# Patient Record
Sex: Male | Born: 1965 | Race: Black or African American | Hispanic: No | Marital: Married | Smoking: Never smoker
Health system: Southern US, Community
[De-identification: ages and names within clinical notes are randomized; demographics above are authoritative.]

## PROBLEM LIST (undated history)

## (undated) DIAGNOSIS — J45909 Unspecified asthma, uncomplicated: Secondary | ICD-10-CM

## (undated) DIAGNOSIS — E559 Vitamin D deficiency, unspecified: Secondary | ICD-10-CM

## (undated) DIAGNOSIS — I10 Essential (primary) hypertension: Secondary | ICD-10-CM

## (undated) HISTORY — PX: KNEE ARTHROSCOPY: SUR90

---
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
## 2015-03-15 ENCOUNTER — Encounter (HOSPITAL_BASED_OUTPATIENT_CLINIC_OR_DEPARTMENT_OTHER): Payer: Self-pay | Admitting: Emergency Medicine

## 2015-03-15 ENCOUNTER — Emergency Department (HOSPITAL_BASED_OUTPATIENT_CLINIC_OR_DEPARTMENT_OTHER)
Admission: EM | Admit: 2015-03-15 | Discharge: 2015-03-15 | Disposition: A | Discharge status: Home / Self Care | Payer: Self-pay | Attending: Physician Assistant | Admitting: Physician Assistant

## 2015-03-15 DIAGNOSIS — M5442 Lumbago with sciatica, left side: Secondary | ICD-10-CM | POA: Insufficient documentation

## 2015-03-15 DIAGNOSIS — M5432 Sciatica, left side: Secondary | ICD-10-CM

## 2015-03-15 DIAGNOSIS — I10 Essential (primary) hypertension: Secondary | ICD-10-CM | POA: Insufficient documentation

## 2015-03-15 DIAGNOSIS — J45909 Unspecified asthma, uncomplicated: Secondary | ICD-10-CM | POA: Insufficient documentation

## 2015-03-15 HISTORY — DX: Essential (primary) hypertension: I10

## 2015-03-15 HISTORY — DX: Unspecified asthma, uncomplicated: J45.909

## 2015-03-15 MED ORDER — IBUPROFEN 800 MG PO TABS
800.0000 mg | ORAL_TABLET | Freq: Once | ORAL | Status: AC
  Administered 2015-03-15: 800 mg via ORAL
  Filled 2015-03-15: qty 1

## 2015-03-15 MED ORDER — NAPROXEN 500 MG PO TABS: 500.0000 mg | ORAL_TABLET | Freq: Two times a day (BID) | ORAL | Status: DC

## 2015-03-15 MED ORDER — CYCLOBENZAPRINE HCL 10 MG PO TABS: 10.0000 mg | ORAL_TABLET | Freq: Two times a day (BID) | ORAL | Status: DC | PRN

## 2015-03-15 MED ORDER — CYCLOBENZAPRINE HCL 10 MG PO TABS
10.0000 mg | ORAL_TABLET | Freq: Once | ORAL | Status: AC
  Administered 2015-03-15: 10 mg via ORAL
  Filled 2015-03-15: qty 1

## 2015-03-15 NOTE — ED Notes (Signed)
Pt in c/o lower left back pain onset 3 days ago, denies injury or difficulty urinating. Denies incontinence.

## 2015-03-15 NOTE — ED Provider Notes (Signed)
CSN: [id number]     Arrival date & time 03/15/15  1955 History   First MD Initiated Contact with Patient 03/15/15 2015     Chief Complaint  Patient presents with  . Back Pain     (Consider location/radiation/quality/duration/timing/severity/associated sxs/prior Treatment) Patient is a 50 y.o. male presenting with back pain. The history is provided by the patient.  Back Pain Location:  Lumbar spine Radiates to: left buttock. Pain severity:  Severe Onset quality:  Gradual Duration:  3 days Timing:  Constant Progression:  Worsening Chronicity:  New Context: twisting   Relieved by:  None tried Worsened by:  Ambulation and bending Ineffective treatments:  None tried Associated symptoms: no bladder incontinence, no bowel incontinence and no dysuria    Stephen Castillo is a 50 y.o. male who presents to the ED with low back pain that started 3 days ago after he was reaching up and felt a pain in his lower back. He thought it was ok and it felt better the next morning but after going to work it got worse again. He has taken nothing for pain.   Past Medical History  Diagnosis Date  . Hypertension   . Asthma    History reviewed. No pertinent past surgical history. History reviewed. No pertinent family history. Social History  Substance Use Topics  . Smoking status: Never Smoker   . Smokeless tobacco: None  . Alcohol Use: No    Review of Systems  Gastrointestinal: Negative for bowel incontinence.  Genitourinary: Negative for bladder incontinence, dysuria, frequency and hematuria.  Musculoskeletal: Positive for back pain.  all other systems negative    Allergies  Review of patient's allergies indicates no known allergies.  Home Medications   Prior to Admission medications   Medication Sig Start Date End Date Taking? Authorizing Provider  cyclobenzaprine (FLEXERIL) 10 MG tablet Take 1 tablet (10 mg total) by mouth 2 (two) times daily as needed for muscle spasms. 03/15/15   Hope Orlene Och, NP  naproxen (NAPROSYN) 500 MG tablet Take 1 tablet (500 mg total) by mouth 2 (two) times daily. 03/15/15   Hope Orlene Och, NP   BP 147/99 mmHg  Pulse 73  Temp(Src) 98.2 F (36.8 C) (Oral)  Resp 18  Ht  (1.727 m)  Wt 97.523 kg  BMI 32.70 kg/m2  SpO2 100% Physical Exam  Constitutional: He is oriented to person, place, and time. He appears well-developed and well-nourished. No distress.  HENT:  Head: Normocephalic and atraumatic.  Eyes: EOM are normal.  Neck: Normal range of motion. Neck supple.  Cardiovascular: Normal rate and regular rhythm.   Pulmonary/Chest: Effort normal. No respiratory distress. He has no wheezes. He has no rales.  Abdominal: Soft. Bowel sounds are normal. There is no tenderness.  Musculoskeletal: Normal range of motion. He exhibits no edema.       Lumbar back: He exhibits tenderness and spasm. He exhibits normal range of motion, no deformity and normal pulse.       Back:  Pain radiates to the left buttock and there is tenderness over the left sciatic nerve with palpation.   Neurological: He is alert and oriented to person, place, and time. He has normal strength. No cranial nerve deficit or sensory deficit. Coordination and gait normal.  Reflex Scores:      Bicep reflexes are 2+ on the right side and 2+ on the left side.      Brachioradialis reflexes are 2+ on the right side and 2+ on the  left side.      Patellar reflexes are 2+ on the right side and 2+ on the left side. DP pulses 2+  Skin: Skin is warm and dry.  Psychiatric: He has a normal mood and affect. His behavior is normal.  Nursing note and vitals reviewed.   ED Course  Procedures MDM  50 y.o. male with 3 day history of low back pain that radiates to the left buttock stable for d/c without neuro deficits noted. No red flags for concern for immediate neuro consult. Will treat for muscle spasm and inflammation. Discussed with the patient and all questioned fully answered. He will return if  any problems arise.   Final diagnoses:  Sciatica, left        Janne Napoleon, NP 03/15/15 2235  Courteney Randall An, MD 03/15/15 2317

## 2015-03-15 NOTE — Discharge Instructions (Signed)

## 2015-03-15 NOTE — ED Notes (Signed)
Pt seen by EDNP prior to RN assessment, see NP notes, pending orders, pt alert, NAD, calm, interactive, resps e/u, no dyspnea noted, family at [facility].

## 2015-03-17 ENCOUNTER — Emergency Department (HOSPITAL_BASED_OUTPATIENT_CLINIC_OR_DEPARTMENT_OTHER): Payer: Self-pay

## 2015-03-17 ENCOUNTER — Emergency Department (HOSPITAL_BASED_OUTPATIENT_CLINIC_OR_DEPARTMENT_OTHER)
Admission: EM | Admit: 2015-03-17 | Discharge: 2015-03-18 | Disposition: A | Discharge status: Home / Self Care | Payer: Self-pay | Attending: Emergency Medicine | Admitting: Emergency Medicine

## 2015-03-17 ENCOUNTER — Encounter (HOSPITAL_BASED_OUTPATIENT_CLINIC_OR_DEPARTMENT_OTHER): Payer: Self-pay

## 2015-03-17 DIAGNOSIS — Z79899 Other long term (current) drug therapy: Secondary | ICD-10-CM | POA: Insufficient documentation

## 2015-03-17 DIAGNOSIS — M62830 Muscle spasm of back: Secondary | ICD-10-CM | POA: Insufficient documentation

## 2015-03-17 DIAGNOSIS — I10 Essential (primary) hypertension: Secondary | ICD-10-CM | POA: Insufficient documentation

## 2015-03-17 DIAGNOSIS — M545 Low back pain: Secondary | ICD-10-CM | POA: Insufficient documentation

## 2015-03-17 DIAGNOSIS — J45909 Unspecified asthma, uncomplicated: Secondary | ICD-10-CM | POA: Insufficient documentation

## 2015-03-17 DIAGNOSIS — Z791 Long term (current) use of non-steroidal anti-inflammatories (NSAID): Secondary | ICD-10-CM | POA: Insufficient documentation

## 2015-03-17 LAB — URINALYSIS, ROUTINE W REFLEX MICROSCOPIC
BILIRUBIN URINE: NEGATIVE
Glucose, UA: NEGATIVE mg/dL
HGB URINE DIPSTICK: NEGATIVE
KETONES UR: NEGATIVE mg/dL
Leukocytes, UA: NEGATIVE
Nitrite: NEGATIVE
PROTEIN: NEGATIVE mg/dL
Specific Gravity, Urine: 1.017 (ref 1.005–1.030)
pH: 5.5 (ref 5.0–8.0)

## 2015-03-17 MED ORDER — KETOROLAC TROMETHAMINE 60 MG/2ML IM SOLN
60.0000 mg | Freq: Once | INTRAMUSCULAR | Status: AC
  Administered 2015-03-17: 60 mg via INTRAMUSCULAR
  Filled 2015-03-17: qty 2

## 2015-03-17 MED ORDER — HYDROCODONE-ACETAMINOPHEN 5-325 MG PO TABS: 1.0000 | ORAL_TABLET | ORAL | Status: DC | PRN

## 2015-03-17 MED ORDER — HYDROMORPHONE HCL 1 MG/ML IJ SOLN
1.0000 mg | Freq: Once | INTRAMUSCULAR | Status: AC
  Administered 2015-03-18: 1 mg via INTRAMUSCULAR
  Filled 2015-03-17: qty 1

## 2015-03-17 MED ORDER — FENTANYL CITRATE (PF) 100 MCG/2ML IJ SOLN
100.0000 ug | Freq: Once | INTRAMUSCULAR | Status: AC
  Administered 2015-03-17: 100 ug via INTRAMUSCULAR
  Filled 2015-03-17: qty 2

## 2015-03-17 MED ORDER — DIAZEPAM 5 MG PO TABS
5.0000 mg | ORAL_TABLET | Freq: Once | ORAL | Status: AC
  Administered 2015-03-17: 5 mg via ORAL
  Filled 2015-03-17: qty 1

## 2015-03-17 MED ORDER — TIZANIDINE HCL 2 MG PO CAPS: 2.0000 mg | ORAL_CAPSULE | Freq: Three times a day (TID) | ORAL | Status: DC

## 2015-03-17 NOTE — ED Provider Notes (Signed)
CSN: [id number]     Arrival date & time 03/17/15  1846 History   First MD Initiated Contact with Patient 03/17/15 2028     Chief Complaint  Patient presents with  . Flank Pain     (Consider location/radiation/quality/duration/timing/severity/associated sxs/prior Treatment) HPI Stephen Castillo is a 50 y.o. male because in for evaluation of left lower back pain. Patient reports symptoms have been ongoing for the past one week. He does remember removing some 35 pound weights when his symptoms started. He reports being seen 2 days in the Emergency department, discharged with ibuprofen and muscle relaxer. He's been taking these medications without relief of his symptoms. Ambulation and certain movements exacerbate his symptoms. Nothing seems to make it better. He denies any urinary symptoms, abdominal pain, diarrhea or constipation, rash. He denies any fevers, chills, numbness or weakness, IV drug use, personal history of cancer. Patient has basilar bedside do express some concern for possible kidney stone.  Past Medical History  Diagnosis Date  . Hypertension   . Asthma    History reviewed. No pertinent past surgical history. No family history on file. Social History  Substance Use Topics  . Smoking status: Never Smoker   . Smokeless tobacco: None  . Alcohol Use: No    Review of Systems A 10 point review of systems was completed and was negative except for pertinent positives and negatives as mentioned in the history of present illness     Allergies  Review of patient's allergies indicates no known allergies.  Home Medications   Prior to Admission medications   Medication Sig Start Date End Date Taking? Authorizing Provider  ALBUTEROL IN Inhale into the lungs.   Yes Historical Provider, MD  Olmesartan-Amlodipine-HCTZ (TRIBENZOR PO) Take by mouth.   Yes Historical Provider, MD  cyclobenzaprine (FLEXERIL) 10 MG tablet Take 1 tablet (10 mg total) by mouth 2 (two) times daily as needed  for muscle spasms. 03/15/15   Hope Orlene Och, NP  HYDROcodone-acetaminophen (NORCO/VICODIN) 5-325 MG tablet Take 1-2 tablets by mouth every 4 (four) hours as needed. 03/17/15   Joycie Peek, PA-C  naproxen (NAPROSYN) 500 MG tablet Take 1 tablet (500 mg total) by mouth 2 (two) times daily. 03/15/15   Hope Orlene Och, NP  tizanidine (ZANAFLEX) 2 MG capsule Take 1 capsule (2 mg total) by mouth 3 (three) times daily. 03/17/15   Enya Bureau, PA-C   BP 135/100 mmHg  Pulse 106  Temp(Src) 99 F (37.2 C) (Oral)  Resp 18  Ht  (1.727 m)  Wt 98.884 kg  BMI 33.15 kg/m2  SpO2 95% Physical Exam  Constitutional:  Awake, alert, nontoxic appearance.  HENT:  Head: Atraumatic.  Eyes: Right eye exhibits no discharge. Left eye exhibits no discharge.  Neck: Neck supple.  Pulmonary/Chest: Effort normal. He exhibits no tenderness.  Abdominal: Soft. There is no tenderness. There is no rebound.  Musculoskeletal: He exhibits no tenderness.  Tenderness diffusely throughout left paraspinal lumbar musculature with no midline bony tenderness. There is spasm of the musculature. No rash or other abnormalities  Neurological:  Mental status and motor strength appears baseline for patient and situation. Motor strength is 5/5 in all 4 extremities. Sensation intact to light touch. Gait is antalgic but no ataxia  Skin: No rash noted.  Psychiatric: He has a normal mood and affect.  Nursing note and vitals reviewed.   ED Course  Procedures (including critical care time) Labs Review Labs Reviewed  URINE CULTURE  URINALYSIS, ROUTINE W REFLEX MICROSCOPIC (NOT  AT [facility])    Imaging Review US Renal  03/17/2015  CLINICAL DATA:  LEFT flank pain for 1 week. EXAM: RENAL / URINARY TRACT ULTRASOUND COMPLETE COMPARISON:  None. FINDINGS: Right Kidney: Length: 10.5 cm. Echogenicity within normal limits. No mass or hydronephrosis visualized. Left Kidney: Length: 10.7 cm. Echogenicity within normal limits. No mass or hydronephrosis  visualized. Prominent interpolar fat. Bladder: Appears normal for degree of bladder distention. IMPRESSION: Negative.  No stones or hydronephrosis. Electronically Signed   By: Elsie Stain M.D.   On: 03/17/2015 22:04   I have personally reviewed and evaluated these images and lab results as part of my medical decision-making.   EKG Interpretation None     Meds given in ED:  Medications  fentaNYL (SUBLIMAZE) injection 100 mcg (100 mcg Intramuscular Given 03/17/15 2054)  ketorolac (TORADOL) injection 60 mg (60 mg Intramuscular Given 03/17/15 2255)  diazepam (VALIUM) tablet 5 mg (5 mg Oral Given 03/17/15 2253)  HYDROmorphone (DILAUDID) injection 1 mg (1 mg Intramuscular Given 03/18/15 0006)    New Prescriptions   HYDROCODONE-ACETAMINOPHEN (NORCO/VICODIN) 5-325 MG TABLET    Take 1-2 tablets by mouth every 4 (four) hours as needed.   TIZANIDINE (ZANAFLEX) 2 MG CAPSULE    Take 1 capsule (2 mg total) by mouth 3 (three) times daily.   Filed Vitals:   03/17/15 1857 03/17/15 2120 03/18/15 0002  BP: 132/94 145/95 135/100  Pulse: 94 94 106  Temp: 98.1 F (36.7 C) 99.5 F (37.5 C) 99 F (37.2 C)  TempSrc: Oral Oral Oral  Resp: Height:  (1.727 m)    Weight: 98.884 kg    SpO2: 98% 97% 95%    MDM  Stephen Castillo is a 50 y.o. male presents for relation of left lower back pain. He does have tenderness to palpation on exam with a palpable spasm. Treated with IM fentanyl. They do express concern for kidney stone. Urinalysis is negative. However, we will obtain ultrasound to evaluate for any hydronephrosis. Ultrasound shows no hydronephrosis or other abnormalities. Symptoms likely secondary to musculoskeletal pain. Nonfocal neuro exam. No pathologic back pain red flags. Plan to discharge with short course pain medicines. Discussed we'll try different muscle relaxer and to discontinue use of previously prescribed muscle relaxer. Patient verbalized understanding and agrees with this plan.  Medication precautions discussed at length the patient agrees. Strict return precautions given including worsening pain, fever, numbness or weakness, changes in bowel or bladder habits. Patient verbalizes understanding and agrees with plan. Final diagnoses:  Left low back pain, with sciatica presence unspecified      Joycie Peek, PA-C 03/18/15 0111  Nelva Nay, MD 03/19/15 [phone]

## 2015-03-17 NOTE — ED Notes (Signed)
C/o left flak pain x 1 week-denies injury, n/v/d, urinary s/s

## 2015-03-17 NOTE — ED Notes (Addendum)
Pt using muscle relaxer that was given along with ibuprofen and ice. States it is not getting any better and symptoms persist.   Addendum: Pt has difficulty with ambulation.

## 2015-03-17 NOTE — ED Notes (Signed)
Came to room to discharge pt.  He stood up and said the pain was still there and too severe to go home.

## 2015-03-17 NOTE — ED Notes (Addendum)
Pt and wife given d/c instructions. Rx x 2. Narcotic prec given. Verbalizes understanding. No questions.

## 2015-03-17 NOTE — ED Notes (Signed)
PA notified that patients pain is not under control. New order obtained.

## 2015-03-17 NOTE — ED Notes (Signed)
Before med admin. PA has been requested at bedside.

## 2015-03-17 NOTE — Discharge Instructions (Signed)
There does not appear to be an emergent cause for your symptoms at this time. Your symptoms are likely due to a musculoskeletal strain. Your urine showed no evidence of infection your ultrasound also showed no sign of kidney stone. Please take your medications as prescribed and as we discussed. Follow up with your doctor as needed for reevaluation. Return to ED for any new or worsening symptoms as we discussed.  Back Pain, Adult Back pain is very common in adults.The cause of back pain is rarely dangerous and the pain often gets better over time.The cause of your back pain may not be known. Some common causes of back pain include:  Strain of the muscles or ligaments supporting the spine.  Wear and tear (degeneration) of the spinal disks.  Arthritis.  Direct injury to the back. For many people, back pain may return. Since back pain is rarely dangerous, most people can learn to manage this condition on their own. HOME CARE INSTRUCTIONS Watch your back pain for any changes. The following actions may help to lessen any discomfort you are feeling:  Remain active. It is stressful on your back to sit or stand in one place for long periods of time. Do not sit, drive, or stand in one place for more than 30 minutes at a time. Take short walks on even surfaces as soon as you are able.Try to increase the length of time you walk each day.  Exercise regularly as directed by your health care provider. Exercise helps your back heal faster. It also helps avoid future injury by keeping your muscles strong and flexible.  Do not stay in bed.Resting more than 1-2 days can delay your recovery.  Pay attention to your body when you bend and lift. The most comfortable positions are those that put less stress on your recovering back. Always use proper lifting techniques, including:  Bending your knees.  Keeping the load close to your body.  Avoiding twisting.  Find a comfortable position to sleep. Use a firm  mattress and lie on your side with your knees slightly bent. If you lie on your back, put a pillow under your knees.  Avoid feeling anxious or stressed.Stress increases muscle tension and can worsen back pain.It is important to recognize when you are anxious or stressed and learn ways to manage it, such as with exercise.  Take medicines only as directed by your health care provider. Over-the-counter medicines to reduce pain and inflammation are often the most helpful.Your health care provider may prescribe muscle relaxant drugs.These medicines help dull your pain so you can more quickly return to your normal activities and healthy exercise.  Apply ice to the injured area:  Put ice in a plastic bag.  Place a towel between your skin and the bag.  Leave the ice on for 20 minutes, 2-3 times a day for the first 2-3 days. After that, ice and heat may be alternated to reduce pain and spasms.  Maintain a healthy weight. Excess weight puts extra stress on your back and makes it difficult to maintain good posture. SEEK MEDICAL CARE IF:  You have pain that is not relieved with rest or medicine.  You have increasing pain going down into the legs or buttocks.  You have pain that does not improve in one week.  You have night pain.  You lose weight.  You have a fever or chills. SEEK IMMEDIATE MEDICAL CARE IF:   You develop new bowel or bladder control problems.  You have  unusual weakness or numbness in your arms or legs.  You develop nausea or vomiting.  You develop abdominal pain.  You feel faint.   This information is not intended to replace advice given to you by your health care provider. Make sure you discuss any questions you have with your health care provider.   Document Released: 01/25/2005 Document Revised: 02/15/2014 Document Reviewed: 05/29/2013 Elsevier Interactive Patient Education [employer].

## 2015-03-17 NOTE — ED Notes (Signed)
Patient transported to Ultrasound 

## 2015-03-18 LAB — URINE CULTURE: Culture: NO GROWTH

## 2015-03-18 NOTE — ED Notes (Signed)
PA at bedside.

## 2018-11-09 DIAGNOSIS — E559 Vitamin D deficiency, unspecified: Secondary | ICD-10-CM

## 2018-11-09 DIAGNOSIS — I10 Essential (primary) hypertension: Secondary | ICD-10-CM

## 2018-11-09 HISTORY — DX: Essential (primary) hypertension: I10

## 2018-11-09 HISTORY — DX: Vitamin D deficiency, unspecified: E55.9

## 2018-11-16 ENCOUNTER — Emergency Department (HOSPITAL_BASED_OUTPATIENT_CLINIC_OR_DEPARTMENT_OTHER): Payer: Self-pay

## 2018-11-16 ENCOUNTER — Observation Stay (HOSPITAL_BASED_OUTPATIENT_CLINIC_OR_DEPARTMENT_OTHER)
Admission: EM | Admit: 2018-11-16 | Discharge: 2018-11-17 | Disposition: A | Discharge status: Home / Self Care | Payer: Self-pay | Attending: Internal Medicine | Admitting: Internal Medicine

## 2018-11-16 ENCOUNTER — Other Ambulatory Visit: Payer: Self-pay

## 2018-11-16 ENCOUNTER — Encounter (HOSPITAL_BASED_OUTPATIENT_CLINIC_OR_DEPARTMENT_OTHER): Payer: Self-pay | Admitting: Emergency Medicine

## 2018-11-16 DIAGNOSIS — I2699 Other pulmonary embolism without acute cor pulmonale: Secondary | ICD-10-CM | POA: Diagnosis present

## 2018-11-16 DIAGNOSIS — N183 Chronic kidney disease, stage 3 unspecified: Secondary | ICD-10-CM | POA: Diagnosis present

## 2018-11-16 DIAGNOSIS — Z79899 Other long term (current) drug therapy: Secondary | ICD-10-CM | POA: Insufficient documentation

## 2018-11-16 DIAGNOSIS — I129 Hypertensive chronic kidney disease with stage 1 through stage 4 chronic kidney disease, or unspecified chronic kidney disease: Secondary | ICD-10-CM | POA: Insufficient documentation

## 2018-11-16 DIAGNOSIS — N1831 Chronic kidney disease, stage 3a: Secondary | ICD-10-CM

## 2018-11-16 DIAGNOSIS — Z23 Encounter for immunization: Secondary | ICD-10-CM | POA: Insufficient documentation

## 2018-11-16 DIAGNOSIS — I2693 Single subsegmental pulmonary embolism without acute cor pulmonale: Principal | ICD-10-CM

## 2018-11-16 DIAGNOSIS — I10 Essential (primary) hypertension: Secondary | ICD-10-CM | POA: Diagnosis present

## 2018-11-16 DIAGNOSIS — J45909 Unspecified asthma, uncomplicated: Secondary | ICD-10-CM | POA: Insufficient documentation

## 2018-11-16 DIAGNOSIS — Z20828 Contact with and (suspected) exposure to other viral communicable diseases: Secondary | ICD-10-CM | POA: Insufficient documentation

## 2018-11-16 LAB — SARS CORONAVIRUS 2 (TAT 6-24 HRS): SARS Coronavirus 2: NEGATIVE

## 2018-11-16 LAB — COMPREHENSIVE METABOLIC PANEL
ALT: 29 U/L (ref 0–44)
AST: 23 U/L (ref 15–41)
Albumin: 4.2 g/dL (ref 3.5–5.0)
Alkaline Phosphatase: 71 U/L (ref 38–126)
Anion gap: 9 (ref 5–15)
BUN: 18 mg/dL (ref 6–20)
CO2: 27 mmol/L (ref 22–32)
Calcium: 8.7 mg/dL — ABNORMAL LOW (ref 8.9–10.3)
Chloride: 104 mmol/L (ref 98–111)
Creatinine, Ser: 1.53 mg/dL — ABNORMAL HIGH (ref 0.61–1.24)
GFR calc Af Amer: >60 mL/min (ref >60)
GFR calc non Af Amer: 52 mL/min — ABNORMAL LOW (ref >60)
Glucose, Bld: 139 mg/dL — ABNORMAL HIGH (ref 70–99)
Potassium: 3.5 mmol/L (ref 3.5–5.1)
Sodium: 140 mmol/L (ref 135–145)
Total Bilirubin: 0.4 mg/dL (ref 0.3–1.2)
Total Protein: 7.2 g/dL (ref 6.5–8.1)

## 2018-11-16 LAB — CBC WITH DIFFERENTIAL/PLATELET
Abs Immature Granulocytes: 0.01 10*3/uL (ref 0.00–0.07)
Basophils Absolute: 0.1 10*3/uL (ref 0.0–0.1)
Basophils Relative: 1 %
Eosinophils Absolute: 0.5 10*3/uL (ref 0.0–0.5)
Eosinophils Relative: 8 %
HCT: 37.1 % — ABNORMAL LOW (ref 39.0–52.0)
Hemoglobin: 12.6 g/dL — ABNORMAL LOW (ref 13.0–17.0)
Immature Granulocytes: 0 %
Lymphocytes Relative: 30 %
Lymphs Abs: 2.1 10*3/uL (ref 0.7–4.0)
MCH: 31 pg (ref 26.0–34.0)
MCHC: 34 g/dL (ref 30.0–36.0)
MCV: 91.4 fL (ref 80.0–100.0)
Monocytes Absolute: 0.6 10*3/uL (ref 0.1–1.0)
Monocytes Relative: 8 %
Neutro Abs: 3.6 10*3/uL (ref 1.7–7.7)
Neutrophils Relative %: 53 %
Platelets: 211 10*3/uL (ref 150–400)
RBC: 4.06 MIL/uL — ABNORMAL LOW (ref 4.22–5.81)
RDW: 14.8 % (ref 11.5–15.5)
WBC: 6.8 10*3/uL (ref 4.0–10.5)
nRBC: 0 % (ref 0.0–0.2)

## 2018-11-16 LAB — HEPARIN LEVEL (UNFRACTIONATED)
Heparin Unfractionated: 1.04 IU/mL — ABNORMAL HIGH (ref 0.30–0.70)
Heparin Unfractionated: 0.8 IU/mL — ABNORMAL HIGH (ref 0.30–0.70)

## 2018-11-16 LAB — HIV ANTIBODY (ROUTINE TESTING W REFLEX): HIV Screen 4th Generation wRfx: NONREACTIVE

## 2018-11-16 LAB — BRAIN NATRIURETIC PEPTIDE: B Natriuretic Peptide: 13.9 pg/mL (ref 0.0–100.0)

## 2018-11-16 LAB — TROPONIN I (HIGH SENSITIVITY)
Troponin I (High Sensitivity): 4 ng/L (ref <18)
Troponin I (High Sensitivity): 4 ng/L (ref <18)

## 2018-11-16 LAB — LIPASE, BLOOD: Lipase: 56 U/L — ABNORMAL HIGH (ref 11–51)

## 2018-11-16 LAB — D-DIMER, QUANTITATIVE (NOT AT ARMC): D-Dimer, Quant: 0.76 ug/mL-FEU — ABNORMAL HIGH (ref 0.00–0.50)

## 2018-11-16 MED ORDER — HEPARIN BOLUS VIA INFUSION
6000.0000 [IU] | Freq: Once | INTRAVENOUS | Status: AC
  Administered 2018-11-16: 6000 [IU] via INTRAVENOUS

## 2018-11-16 MED ORDER — HEPARIN (PORCINE) 25000 UT/250ML-% IV SOLN
1500.0000 [IU]/h | INTRAVENOUS | Status: DC
  Administered 2018-11-16: 1500 [IU]/h via INTRAVENOUS
  Filled 2018-11-16: qty 250

## 2018-11-16 MED ORDER — ACETAMINOPHEN 650 MG RE SUPP: 650.0000 mg | Freq: Four times a day (QID) | RECTAL | Status: DC | PRN

## 2018-11-16 MED ORDER — ASPIRIN 325 MG PO TABS
325.0000 mg | ORAL_TABLET | Freq: Once | ORAL | Status: AC
  Administered 2018-11-16: 325 mg via ORAL
  Filled 2018-11-16: qty 1

## 2018-11-16 MED ORDER — IOHEXOL 350 MG/ML SOLN
100.0000 mL | Freq: Once | INTRAVENOUS | Status: AC | PRN
  Administered 2018-11-16: 100 mL via INTRAVENOUS

## 2018-11-16 MED ORDER — ACETAMINOPHEN 325 MG PO TABS: 650.0000 mg | ORAL_TABLET | Freq: Four times a day (QID) | ORAL | Status: DC | PRN

## 2018-11-16 MED ORDER — LOSARTAN POTASSIUM 50 MG PO TABS
100.0000 mg | ORAL_TABLET | Freq: Every day | ORAL | Status: DC
  Administered 2018-11-16: 100 mg via ORAL
  Filled 2018-11-16: qty 2

## 2018-11-16 MED ORDER — INFLUENZA VAC SPLIT QUAD 0.5 ML IM SUSY
0.5000 mL | PREFILLED_SYRINGE | INTRAMUSCULAR | Status: AC
  Administered 2018-11-17: 0.5 mL via INTRAMUSCULAR
  Filled 2018-11-16: qty 0.5

## 2018-11-16 MED ORDER — ALLOPURINOL 300 MG PO TABS
300.0000 mg | ORAL_TABLET | Freq: Every day | ORAL | Status: DC
  Administered 2018-11-16: 300 mg via ORAL
  Filled 2018-11-16: qty 1

## 2018-11-16 MED ORDER — AMLODIPINE BESYLATE 10 MG PO TABS
10.0000 mg | ORAL_TABLET | Freq: Every day | ORAL | Status: DC
  Administered 2018-11-17: 10 mg via ORAL
  Filled 2018-11-16: qty 1

## 2018-11-16 MED ORDER — HEPARIN (PORCINE) 25000 UT/250ML-% IV SOLN
1150.0000 [IU]/h | INTRAVENOUS | Status: DC
  Administered 2018-11-16: 1350 [IU]/h via INTRAVENOUS
  Filled 2018-11-16: qty 250

## 2018-11-16 MED ORDER — ONDANSETRON HCL 4 MG/2ML IJ SOLN: 4.0000 mg | Freq: Four times a day (QID) | INTRAMUSCULAR | Status: DC | PRN

## 2018-11-16 MED ORDER — ONDANSETRON HCL 4 MG PO TABS: 4.0000 mg | ORAL_TABLET | Freq: Four times a day (QID) | ORAL | Status: DC | PRN

## 2018-11-16 NOTE — Progress Notes (Signed)
ANTICOAGULATION CONSULT NOTE - Follow Up Consult  Pharmacy Consult for heparin Indication: pulmonary embolus  No Known Allergies  Patient Measurements: Height: 5\' 8"  (172.7 cm) Weight: 224 lb (101.6 kg) IBW/kg (Calculated) : 68.4 Heparin Dosing Weight: 89  Vital Signs: Temp: 98.4 F (36.9 C) (10/08 2002) Temp Source: Oral (10/08 1849) BP: 153/98 (10/08 2002) Pulse Rate: 83 (10/08 2002)  Labs: Recent Labs    11/16/18 0313 11/16/18 0540 11/16/18 1322 11/16/18 2210  HGB 12.6*  --   --   --   HCT 37.1*  --   --   --   PLT 211  --   --   --   HEPARINUNFRC  --   --  1.04* 0.80*  CREATININE 1.53*  --   --   --   TROPONINIHS 4 4  --   --     Estimated Creatinine Clearance: 66 mL/min (A) (by C-G formula based on SCr of 1.53 mg/dL (H)).   Medications:  Medications Prior to Admission  Medication Sig Dispense Refill Last Dose  . ALBUTEROL IN Inhale into the lungs.     . cyclobenzaprine (FLEXERIL) 10 MG tablet Take 1 tablet (10 mg total) by mouth 2 (two) times daily as needed for muscle spasms. 20 tablet 0   . HYDROcodone-acetaminophen (NORCO/VICODIN) 5-325 MG tablet Take 1-2 tablets by mouth every 4 (four) hours as needed. 10 tablet 0   . naproxen (NAPROSYN) 500 MG tablet Take 1 tablet (500 mg total) by mouth 2 (two) times daily. 30 tablet 0   . Olmesartan-Amlodipine-HCTZ (TRIBENZOR PO) Take by mouth.     . tizanidine (ZANAFLEX) 2 MG capsule Take 1 capsule (2 mg total) by mouth 3 (three) times daily. 20 capsule 0     Assessment:  Patient condition as noted above. Rec'd 6000 unit heparin bolus & infusion at 1500 units per hour.  Heparin level = 0.8, supra-therapeutic for rate of 1350 units/hr.  No bleeding or issues with infusion per RN.   Goal of Therapy:  Heparin level 0.3-0.7 units/ml Monitor platelets by anticoagulation protocol: Yes   Plan:  Reduce Heparin to 1150 units/hr.  Follow-up AM HL and CBC.   Sloan Leiter, PharmD, BCPS, BCCCP Clinical  Pharmacist Clinical phone 11/16/2018 until 10:30P [phone] Please refer to AMION for [place] numbers 11/16/2018,10:55 PM

## 2018-11-16 NOTE — Progress Notes (Signed)
ANTICOAGULATION CONSULT NOTE - Follow Up Consult  Pharmacy Consult for heparin Indication: pulmonary embolus  No Known Allergies  Patient Measurements: Height: 5\' 8"  (172.7 cm) Weight: 219 lb (99.3 kg) IBW/kg (Calculated) : 68.4 Heparin Dosing Weight: 89  Vital Signs: BP: 145/90 (10/08 1500) Pulse Rate: 84 (10/08 1500)  Labs: Recent Labs    11/16/18 0313 11/16/18 0540 11/16/18 1322  HGB 12.6*  --   --   HCT 37.1*  --   --   PLT 211  --   --   HEPARINUNFRC  --   --  1.04*  CREATININE 1.53*  --   --   TROPONINIHS 4 4  --     Estimated Creatinine Clearance: 65.3 mL/min (A) (by C-G formula based on SCr of 1.53 mg/dL (H)).   Medications:  (Not in a hospital admission)   Assessment:  Patient condition as noted above. Rec'd 6000 unit heparin bolus & infusion at 1500 units per hour.  Heparin level = 1.04 (7 hrs after bolus/infusion start)  Goal of Therapy:  Heparin level 0.3-0.7 units/ml Monitor platelets by anticoagulation protocol: Yes   Plan:  Hold heparin infusion for 1 hour - restart at 1350 units per hour. Continue to monitor for heparin level 0.3 to 0.7.   Starlynn Klinkner A Keishawn Rajewski 11/16/2018,4:00 PM

## 2018-11-16 NOTE — Progress Notes (Signed)
ANTICOAGULATION CONSULT NOTE - Initial Consult  Pharmacy Consult for heparin Indication: pulmonary embolus  No Known Allergies  Patient Measurements: Height: 5\' 8"  (172.7 cm) Weight: 219 lb (99.3 kg) IBW/kg (Calculated) : 68.4 Heparin Dosing Weight: 89 kg  Vital Signs: Temp: 98 F (36.7 C) (10/08 0252) Temp Source: Oral (10/08 0252) BP: 143/99 (10/08 0430) Pulse Rate: 83 (10/08 0430)  Labs: Recent Labs    11/16/18 0313  HGB 12.6*  HCT 37.1*  PLT 211  CREATININE 1.53*  TROPONINIHS 4    Estimated Creatinine Clearance: 65.3 mL/min (A) (by C-G formula based on SCr of 1.53 mg/dL (H)).  Assessment: CC/HPI: 53 yo m presenting with chest tightness and SOB; found to have small PE in LLL  PMH: asthma HTN  Anticoag: none pta - iv hep for pe  Renal: SCr 1.53  Heme/Onc: H&H 12.6/37.1, Plt 211  Goal of Therapy:  Heparin level 0.3-0.7 units/ml Monitor platelets by anticoagulation protocol: Yes   Plan:  Heparin bolus 6000 units x 1 Heparin gtt 1500 units/hr 1200 HL  Daily HL CBC - did not order F/U plans for oral [facility]  Levester Fresh, PharmD, BCPS, BCCCP Clinical Pharmacist [phone]  Please check AMION for all [place] numbers  11/16/2018 5:37 AM

## 2018-11-16 NOTE — ED Notes (Signed)
Returned from xray

## 2018-11-16 NOTE — ED Notes (Signed)
Patient transported to CT 

## 2018-11-16 NOTE — ED Triage Notes (Signed)
Pt c/o chest tightness and shob. Pt has noted nasal congestion that started yesterday. Denies cough.

## 2018-11-16 NOTE — ED Notes (Signed)
Patient transported to X-ray 

## 2018-11-16 NOTE — ED Notes (Signed)
Signature for consent of transfer unable to be obtained due to signature pad errors- this RN and toni, RN signed with pt verbal consent.

## 2018-11-16 NOTE — ED Notes (Signed)
[facility] arrived to transfer pt to [facility]. VSS. Heparin infusing

## 2018-11-16 NOTE — H&P (Signed)
History and Physical    Stephen Castillo MWU:[id number] DOB: [birth year] DOA: 11/16/2018  PCP: Patient, No Pcp Per  Patient coming from: Home.  Chief Complaint: Chest pain and shortness of breath.  HPI: Stephen Castillo is a 53 y.o. male with history of hypertension, chronic kidney disease, chronic anemia, asthma started experiencing left-sided chest pain.  Shortness of breath over the last 2 days.  Chest pain is like pleuritic in nature present even at rest.  Did not have any fever chills or productive cough.  Given the persistent symptoms patient presented to the ER at [facility].  Patient states he has been having lower extremity edema for last few months.  ED Course: In the ER patient was hemodynamically stable.  Labs showed troponin of 4 and 4 BNP 13.9 hemoglobin 12.6 creatinine 1.5 d-dimer was 0.76 CT angiogram showed single subsegmental pulmonary embolism on the left side.  EKG showed normal sinus rhythm.  Given the patient was still having chest pressure and shortness of breath patient admitted for further observation.  Patient was started on heparin.  Review of Systems: As per HPI, rest all negative.   Past Medical History:  Diagnosis Date   Asthma    Hypertension     History reviewed. No pertinent surgical history.   reports that he has never smoked. He does not have any smokeless tobacco history on file. He reports that he does not drink alcohol or use drugs.  No Known Allergies  Family History - Father Mother are [employer].  Prior to Admission medications   Medication Sig Start Date End Date Taking? Authorizing Provider  ALBUTEROL IN Inhale into the lungs.    [provider]  cyclobenzaprine (FLEXERIL) 10 MG tablet Take 1 tablet (10 mg total) by mouth 2 (two) times daily as needed for muscle spasms. 03/15/15   Janne Napoleon, NP  HYDROcodone-acetaminophen (NORCO/VICODIN) 5-325 MG tablet Take 1-2 tablets by mouth every 4 (four) hours as needed. 03/17/15    Cartner, Sharlet Salina, PA-C  naproxen (NAPROSYN) 500 MG tablet Take 1 tablet (500 mg total) by mouth 2 (two) times daily. 03/15/15   Janne Napoleon, NP  Olmesartan-Amlodipine-HCTZ (TRIBENZOR PO) Take by mouth.    [provider]  tizanidine (ZANAFLEX) 2 MG capsule Take 1 capsule (2 mg total) by mouth 3 (three) times daily. 03/17/15   Joycie Peek, PA-C    Physical Exam: Constitutional: Moderately built and nourished. Vitals:   11/16/18 1700 11/16/18 1730 11/16/18 1849 11/16/18 2002  BP: (!) 147/86 138/88 (!) 152/95 (!) 153/98  Pulse: 84 66 73 83  Resp: (!) 21 (!) 27    Temp:   97.8 F (36.6 C) 98.4 F (36.9 C)  TempSrc:   Oral   SpO2: 93% 92% 99% 98%  Weight:   101.6 kg   Height:   5\' 8"  (1.727 m)    Eyes: Anicteric no pallor. ENMT: No discharge from the ears eyes nose or mouth. Neck: No JVD appreciated no mass felt. Respiratory: No rhonchi or crepitations. Cardiovascular: S1-S2 heard. Abdomen: Soft nontender bowel sounds present. Musculoskeletal: Mild edema of the both lower extremities. Skin: No rash. Neurologic: Alert awake oriented to time place and person.  Moves all extremities. Psychiatric: Appears normal.   Labs on Admission: I have personally reviewed following labs and imaging studies  CBC: Recent Labs  Lab 11/16/18 0313  WBC 6.8  NEUTROABS 3.6  HGB 12.6*  HCT 37.1*  MCV 91.4  PLT 211   Basic Metabolic Panel:  Recent Labs  Lab 11/16/18 0313  NA 140  K 3.5  CL 104  CO2 27  GLUCOSE 139*  BUN 18  CREATININE 1.53*  CALCIUM 8.7*   GFR: Estimated Creatinine Clearance: 66 mL/min (A) (by C-G formula based on SCr of 1.53 mg/dL (H)). Liver Function Tests: Recent Labs  Lab 11/16/18 0313  AST 23  ALT 29  ALKPHOS 71  BILITOT 0.4  PROT 7.2  ALBUMIN 4.2   Recent Labs  Lab 11/16/18 0313  LIPASE 56*   No results for input(s): AMMONIA in the last 168 hours. Coagulation Profile: No results for input(s): INR, PROTIME in the last 168  hours. Cardiac Enzymes: No results for input(s): CKTOTAL, CKMB, CKMBINDEX, TROPONINI in the last 168 hours. BNP (last 3 results) No results for input(s): PROBNP in the last 8760 hours. HbA1C: No results for input(s): HGBA1C in the last 72 hours. CBG: No results for input(s): GLUCAP in the last 168 hours. Lipid Profile: No results for input(s): CHOL, HDL, LDLCALC, TRIG, CHOLHDL, LDLDIRECT in the last 72 hours. Thyroid Function Tests: No results for input(s): TSH, T4TOTAL, FREET4, T3FREE, THYROIDAB in the last 72 hours. Anemia Panel: No results for input(s): VITAMINB12, FOLATE, FERRITIN, TIBC, IRON, RETICCTPCT in the last 72 hours. Urine analysis:    Component Value Date/Time   COLORURINE YELLOW 03/17/2015 1935   APPEARANCEUR CLEAR 03/17/2015 1935   LABSPEC 1.017 03/17/2015 1935   PHURINE 5.5 03/17/2015 1935   GLUCOSEU NEGATIVE 03/17/2015 1935   HGBUR NEGATIVE 03/17/2015 1935   BILIRUBINUR NEGATIVE 03/17/2015 1935   KETONESUR NEGATIVE 03/17/2015 1935   PROTEINUR NEGATIVE 03/17/2015 1935   NITRITE NEGATIVE 03/17/2015 1935   LEUKOCYTESUR NEGATIVE 03/17/2015 1935   Sepsis Labs: @LABRCNTIP (procalcitonin:4,lacticidven:4) ) Recent Results (from the past 240 hour(s))  SARS CORONAVIRUS 2 (TAT 6-24 HRS) Nasopharyngeal Nasopharyngeal Swab     Status: None   Collection Time: 11/16/18  8:11 AM   Specimen: Nasopharyngeal Swab  Result Value Ref Range Status   SARS Coronavirus 2 NEGATIVE NEGATIVE Final    Comment: (NOTE) SARS-CoV-2 target nucleic acids are NOT DETECTED. The SARS-CoV-2 RNA is generally detectable in upper and lower respiratory specimens during the acute phase of infection. Negative results do not preclude SARS-CoV-2 infection, do not rule out co-infections with other pathogens, and should not be used as the sole basis for treatment or other patient management decisions. Negative results must be combined with clinical observations, patient history, and epidemiological  information. The expected result is Negative. Fact Sheet for Patients: HairSlick.nohttps://www.fda.gov/media/138098/download Fact Sheet for Healthcare Providers: quierodirigir.comhttps://www.fda.gov/media/138095/download This test is not yet approved or cleared by the [place] FDA and  has been authorized for detection and/or diagnosis of SARS-CoV-2 by FDA under an Emergency Use Authorization (EUA). This EUA will remain  in effect (meaning this test can be used) for the duration of the COVID-19 declaration under Section 56 4(b)(1) of the Act, 21 U.S.C. section 360bbb-3(b)(1), unless the authorization is terminated or revoked sooner. Performed at [facility], [address] [place], [place] [address]      Radiological Exams on Admission: Dg Chest 2 View  Result Date: 11/16/2018 CLINICAL DATA:  Chest pain EXAM: CHEST - 2 VIEW COMPARISON:  None. FINDINGS: The heart size and mediastinal contours are within normal limits. Both lungs are clear. The visualized skeletal structures are unremarkable. IMPRESSION: No active cardiopulmonary disease. Electronically Signed   By: Jasmine PangKim  Fujinaga M.D.   On: 11/16/2018 03:26   Ct Angio Chest Pe W And/or Wo Contrast  Result Date: 11/16/2018 CLINICAL DATA:  Pulmonary embolism suspected. Positive D-dimer with chest tightness EXAM: CT ANGIOGRAPHY CHEST WITH CONTRAST TECHNIQUE: Multidetector CT imaging of the chest was performed using the standard protocol during bolus administration of intravenous contrast. Multiplanar CT image reconstructions and MIPs were obtained to evaluate the vascular anatomy. CONTRAST:  176mL OMNIPAQUE IOHEXOL 350 MG/ML SOLN COMPARISON:  None. FINDINGS: Cardiovascular: Single branching pulmonary artery filling defect within a left lower lobe segment, nonocclusive. Normal heart size. No pericardial effusion. Negative aorta Mediastinum/Nodes: Negative for adenopathy or mass Lungs/Pleura: There is no edema, consolidation, effusion, or pneumothorax. There are  a few pulmonary cysts that are nonspecific. Borderline generalized airway thickening. Upper Abdomen: Probable hepatic steatosis based on sparing at the gallbladder fossa. Musculoskeletal: No acute or aggressive finding Review of the MIP images confirms the above findings. IMPRESSION: Single nonocclusive segmental pulmonary embolism in the left lower lobe. Electronically Signed   By: Monte Fantasia M.D.   On: 11/16/2018 05:29    EKG: Independently reviewed.  Normal sinus rhythm.  Assessment/Plan Principal Problem:   Single subsegmental pulmonary embolism without acute cor pulmonale (HCC) Active Problems:   Essential hypertension   Pulmonary embolism (HCC)   CKD (chronic kidney disease) stage 3, GFR 30-59 ml/min    1. Pulmonary embolism unprovoked hemodynamically stable -CT scan does not show any right heart strain.  Troponins are negative.  We will check Dopplers of the lower extremity.  On heparin.  If continues to remain hemodynamically stable change to oral anticoagulant. 2. Hypertension on losartan. 3. History of gout on allopurinol. 4. History of asthma on PRN albuterol.  Presently not wheezing. 5. Chronic kidney disease stage III creatinine appears to be at baseline when compared to the old labs in 2018 in care everywhere. 6. Chronic anemia appears to have a hemoglobin at baseline when compared to old labs in care everywhere.   DVT prophylaxis: Heparin. Code Status: Full code. Family Communication: Discussed with patient. Disposition Plan: Home. Consults called: None. Admission status: Observation.   Rise Patience MD [facility] Pager [phone].  If 7PM-7AM, please contact night-coverage [email or web address] Password [email or web address]  11/16/2018, 10:05 PM

## 2018-11-16 NOTE — ED Provider Notes (Signed)
Emergency Department Provider Note   I have reviewed the triage vital signs and the nursing notes.   HISTORY  Chief Complaint Chest Pain   HPI Stephen Castillo is a 53 y.o. male with past medical history of asthma hypertension who presents the emergency department today secondary chest pain.  Patient states he is had some left-sided sharp chest pain worse with movement and worse with deep breaths since earlier in the night.  Patient states it was in his ribs bilaterally prior to that. States he has persistent high blood pressure which does not seem to be improving with his losartan.  Patient states he never really had this before.  No associated shortness of breath, fever, cough, nausea, vomiting, diaphoresis, lightheadedness, radiation.  Has not take anything for symptoms yet. Both parents died before 70 with heart attacks.   No other associated or modifying symptoms.    Past Medical History:  Diagnosis Date  . Asthma   . Hypertension     Patient Active Problem List   Diagnosis Date Noted  . Pulmonary embolism (HCC) 11/16/2018    History reviewed. No pertinent surgical history.  Current Outpatient Rx  . Order #: [id number] Class: Historical Med  . Order #: [id number] Class: Print  . Order #: [id number] Class: Print  . Order #: [id number] Class: Print  . Order #: [id number] Class: Historical Med  . Order #: [id number] Class: Print    Allergies Patient has no known allergies.  No family history on file.  Social History Social History   Tobacco Use  . Smoking status: Never Smoker  Substance Use Topics  . Alcohol use: No  . Drug use: No    Review of Systems  All other systems negative except as documented in the HPI. All pertinent positives and negatives as reviewed in the HPI. ____________________________________________   PHYSICAL EXAM:  VITAL SIGNS: ED Triage Vitals  Enc Vitals Group     BP 11/16/18 0252 (!) 166/103     Pulse Rate 11/16/18 0252 81     Resp  11/16/18 0252 18     Temp 11/16/18 0252 98 F (36.7 C)     Temp Source 11/16/18 0252 Oral     SpO2 11/16/18 0252 95 %     Weight 11/16/18 0249 219 lb (99.3 kg)     Height 11/16/18 0249 5\' 8"  (1.727 m)    Constitutional: Alert and oriented. Well appearing and in no acute distress. Eyes: Conjunctivae are normal. PERRL. EOMI. Head: Atraumatic. Nose: No congestion/rhinnorhea. Mouth/Throat: Mucous membranes are moist.  Oropharynx non-erythematous. Neck: No stridor.  No meningeal signs.   Cardiovascular: Normal rate, regular rhythm. Good peripheral circulation. Grossly normal heart sounds.   Respiratory: Normal respiratory effort.  No retractions. Lungs CTAB. Gastrointestinal: Soft and nontender. No distention.  Musculoskeletal: No lower extremity tenderness nor edema. No gross deformities of extremities. Neurologic:  Normal speech and language. No gross focal neurologic deficits are appreciated.  Skin:  Skin is warm, dry and intact. No rash noted.   ____________________________________________   LABS (all labs ordered are listed, but only abnormal results are displayed)  Labs Reviewed  CBC WITH DIFFERENTIAL/PLATELET - Abnormal; Notable for the following components:      Result Value   RBC 4.06 (*)    Hemoglobin 12.6 (*)    HCT 37.1 (*)    All other components within normal limits  COMPREHENSIVE METABOLIC PANEL - Abnormal; Notable for the following components:   Glucose, Bld 139 (*)    Creatinine, Ser 1.53 (*)  Calcium 8.7 (*)    GFR calc non Af Amer 52 (*)    All other components within normal limits  LIPASE, BLOOD - Abnormal; Notable for the following components:   Lipase 56 (*)    All other components within normal limits  D-DIMER, QUANTITATIVE (NOT AT [facility]) - Abnormal; Notable for the following components:   D-Dimer, Quant 0.76 (*)    All other components within normal limits  BRAIN NATRIURETIC PEPTIDE  HEPARIN LEVEL (UNFRACTIONATED)  TROPONIN I (HIGH SENSITIVITY)   TROPONIN I (HIGH SENSITIVITY)   ____________________________________________  EKG   EKG Interpretation  Date/Time:  Thursday November 16 2018 02:56:28 EDT Ventricular Rate:  80 PR Interval:    QRS Duration: 77 QT Interval:  372 QTC Calculation: 430 R Axis:   46 Text Interpretation:  Sinus rhythm Probable left atrial enlargement Nonspecific T abnormalities, diffuse leads No old tracing to compare Confirmed by Merrily Pew [id number]) on 11/16/2018 3:02:11 AM       ____________________________________________  RADIOLOGY  Dg Chest 2 View  Result Date: 11/16/2018 CLINICAL DATA:  Chest pain EXAM: CHEST - 2 VIEW COMPARISON:  None. FINDINGS: The heart size and mediastinal contours are within normal limits. Both lungs are clear. The visualized skeletal structures are unremarkable. IMPRESSION: No active cardiopulmonary disease. Electronically Signed   By: Donavan Foil M.D.   On: 11/16/2018 03:26   Ct Angio Chest Pe W And/or Wo Contrast  Result Date: 11/16/2018 CLINICAL DATA:  Pulmonary embolism suspected. Positive D-dimer with chest tightness EXAM: CT ANGIOGRAPHY CHEST WITH CONTRAST TECHNIQUE: Multidetector CT imaging of the chest was performed using the standard protocol during bolus administration of intravenous contrast. Multiplanar CT image reconstructions and MIPs were obtained to evaluate the vascular anatomy. CONTRAST:  149mL OMNIPAQUE IOHEXOL 350 MG/ML SOLN COMPARISON:  None. FINDINGS: Cardiovascular: Single branching pulmonary artery filling defect within a left lower lobe segment, nonocclusive. Normal heart size. No pericardial effusion. Negative aorta Mediastinum/Nodes: Negative for adenopathy or mass Lungs/Pleura: There is no edema, consolidation, effusion, or pneumothorax. There are a few pulmonary cysts that are nonspecific. Borderline generalized airway thickening. Upper Abdomen: Probable hepatic steatosis based on sparing at the gallbladder fossa. Musculoskeletal: No acute or  aggressive finding Review of the MIP images confirms the above findings. IMPRESSION: Single nonocclusive segmental pulmonary embolism in the left lower lobe. Electronically Signed   By: Monte Fantasia M.D.   On: 11/16/2018 05:29    ____________________________________________   PROCEDURES  Procedure(s) performed:   .Critical Care Performed by: Merrily Pew, MD Authorized by: Merrily Pew, MD   Critical care provider statement:    Critical care time (minutes):  4   Critical care was time spent personally by me on the following activities:  Discussions with consultants, evaluation of patient's response to treatment, examination of patient, ordering and performing treatments and interventions, ordering and review of laboratory studies, ordering and review of radiographic studies, pulse oximetry, re-evaluation of patient's condition, obtaining history from patient or surrogate and review of old charts     ____________________________________________   INITIAL IMPRESSION / [place] / ED COURSE  Abnormal ecg, family history, HTN, obesity are all RF for possible ACS however the symptoms and presentation are all classic for MSK pain. Plan for delta troponins and reeval for disposition. Needs to see a cardiologist either way.   PE on ct scan. This with mild hypoxia, tachypnea, ecg changes, will start heparin and admit.   Pertinent labs & imaging results that were available during my care of  the patient were reviewed by me and considered in my medical decision making (see chart for details).  ____________________________________________  FINAL CLINICAL IMPRESSION(S) / ED DIAGNOSES  Final diagnoses:  Single subsegmental pulmonary embolism without acute cor pulmonale (HCC)     MEDICATIONS GIVEN DURING THIS VISIT:  Medications  heparin ADULT infusion 100 units/mL (25000 units/26850mL sodium chloride 0.45%) (1,500 Units/hr Intravenous New Bag/Given 11/16/18 0550)  aspirin  tablet 325 mg (325 mg Oral Given 11/16/18 0318)  iohexol (OMNIPAQUE) 350 MG/ML injection 100 mL (100 mLs Intravenous Contrast Given 11/16/18 0452)  heparin bolus via infusion 6,000 Units (6,000 Units Intravenous Bolus from Bag 11/16/18 0550)     NEW OUTPATIENT MEDICATIONS STARTED DURING THIS VISIT:  New Prescriptions   No medications on file    Note:  This note was prepared with assistance of Dragon voice recognition software. Occasional wrong-word or sound-a-like substitutions may have occurred due to the inherent limitations of voice recognition software.   Laurice Iglesia, Barbara CowerJason, MD 11/16/18 [id number]

## 2018-11-16 NOTE — ED Notes (Signed)
ED Provider at bedside. 

## 2018-11-17 ENCOUNTER — Observation Stay (HOSPITAL_BASED_OUTPATIENT_CLINIC_OR_DEPARTMENT_OTHER): Payer: Self-pay

## 2018-11-17 ENCOUNTER — Encounter (HOSPITAL_COMMUNITY): Payer: Self-pay

## 2018-11-17 DIAGNOSIS — I2699 Other pulmonary embolism without acute cor pulmonale: Secondary | ICD-10-CM

## 2018-11-17 LAB — CBC
HCT: 37.6 % — ABNORMAL LOW (ref 39.0–52.0)
Hemoglobin: 13.3 g/dL (ref 13.0–17.0)
MCH: 31.7 pg (ref 26.0–34.0)
MCHC: 35.4 g/dL (ref 30.0–36.0)
MCV: 89.7 fL (ref 80.0–100.0)
Platelets: 219 10*3/uL (ref 150–400)
RBC: 4.19 MIL/uL — ABNORMAL LOW (ref 4.22–5.81)
RDW: 14.6 % (ref 11.5–15.5)
WBC: 5.9 10*3/uL (ref 4.0–10.5)
nRBC: 0 % (ref 0.0–0.2)

## 2018-11-17 LAB — BASIC METABOLIC PANEL
Anion gap: 12 (ref 5–15)
BUN: 11 mg/dL (ref 6–20)
CO2: 26 mmol/L (ref 22–32)
Calcium: 8.8 mg/dL — ABNORMAL LOW (ref 8.9–10.3)
Chloride: 103 mmol/L (ref 98–111)
Creatinine, Ser: 1.32 mg/dL — ABNORMAL HIGH (ref 0.61–1.24)
GFR calc Af Amer: >60 mL/min (ref >60)
GFR calc non Af Amer: >60 mL/min (ref >60)
Glucose, Bld: 133 mg/dL — ABNORMAL HIGH (ref 70–99)
Potassium: 3.9 mmol/L (ref 3.5–5.1)
Sodium: 141 mmol/L (ref 135–145)

## 2018-11-17 LAB — HEPARIN LEVEL (UNFRACTIONATED): Heparin Unfractionated: 0.62 IU/mL (ref 0.30–0.70)

## 2018-11-17 LAB — TROPONIN I (HIGH SENSITIVITY): Troponin I (High Sensitivity): 4 ng/L (ref <18)

## 2018-11-17 MED ORDER — ALBUTEROL SULFATE (2.5 MG/3ML) 0.083% IN NEBU
3.0000 mL | INHALATION_SOLUTION | RESPIRATORY_TRACT | Status: DC | PRN
  Administered 2018-11-17: 3 mL via RESPIRATORY_TRACT
  Filled 2018-11-17: qty 3

## 2018-11-17 MED ORDER — RIVAROXABAN 20 MG PO TABS: 20.0000 mg | ORAL_TABLET | Freq: Every day | ORAL | Status: DC

## 2018-11-17 MED ORDER — HYDROCHLOROTHIAZIDE 12.5 MG PO CAPS: 12.5000 mg | ORAL_CAPSULE | Freq: Every day | ORAL | 1 refills | Status: DC

## 2018-11-17 MED ORDER — ALLOPURINOL 300 MG PO TABS: 300.0000 mg | ORAL_TABLET | Freq: Every day | ORAL | 0 refills | Status: DC

## 2018-11-17 MED ORDER — AMLODIPINE BESYLATE 10 MG PO TABS: 10.0000 mg | ORAL_TABLET | Freq: Every day | ORAL | 1 refills | Status: DC

## 2018-11-17 MED ORDER — RIVAROXABAN (XARELTO) VTE STARTER PACK (15 & 20 MG): ORAL_TABLET | ORAL | 0 refills | Status: DC

## 2018-11-17 MED ORDER — AMLODIPINE BESYLATE 10 MG PO TABS: 10.0000 mg | ORAL_TABLET | Freq: Every day | ORAL | Status: DC

## 2018-11-17 MED ORDER — LOSARTAN POTASSIUM 100 MG PO TABS: 100.0000 mg | ORAL_TABLET | Freq: Every day | ORAL | 0 refills | Status: DC

## 2018-11-17 MED ORDER — ALLOPURINOL 300 MG PO TABS: 300.0000 mg | ORAL_TABLET | Freq: Every day | ORAL | Status: DC

## 2018-11-17 MED ORDER — HYDROCHLOROTHIAZIDE 12.5 MG PO CAPS
12.5000 mg | ORAL_CAPSULE | Freq: Every day | ORAL | Status: DC
  Administered 2018-11-17: 12.5 mg via ORAL
  Filled 2018-11-17: qty 1

## 2018-11-17 MED ORDER — RIVAROXABAN 15 MG PO TABS
15.0000 mg | ORAL_TABLET | Freq: Two times a day (BID) | ORAL | Status: DC
  Administered 2018-11-17: 15 mg via ORAL
  Filled 2018-11-17: qty 1

## 2018-11-17 MED ORDER — LOSARTAN POTASSIUM 100 MG PO TABS: 100.0000 mg | ORAL_TABLET | Freq: Every day | ORAL | Status: DC

## 2018-11-17 MED ORDER — HYDRALAZINE HCL 20 MG/ML IJ SOLN: 10.0000 mg | INTRAMUSCULAR | Status: DC | PRN

## 2018-11-17 MED FILL — LOSARTAN POTASSIUM 100 MG T: 100 | 30 days supply | Qty: 30 | Fill #0

## 2018-11-17 MED FILL — HYDROCHLOROTHIAZIDE 12.5 MG: 12.5 | 30 days supply | Qty: 30 | Fill #0

## 2018-11-17 MED FILL — ALLOPURINOL 100 MG TABLET: 100 | 30 days supply | Qty: 90 | Fill #0

## 2018-11-17 MED FILL — AMLODIPINE BESYLATE 10 MG T: 10 | 30 days supply | Qty: 30 | Fill #0

## 2018-11-17 MED FILL — XARELTO STARTER PACK: 15 & 20 | 30 days supply | Qty: 51 | Fill #0

## 2018-11-17 NOTE — Discharge Instructions (Signed)
Information on my medicine - XARELTO (rivaroxaban)  This medication education was reviewed with me or my healthcare representative as part of my discharge preparation.  The pharmacist that spoke with me during my hospital stay was:  Einar Grad, [facility]  WHY WAS [place]? Xarelto was prescribed to treat blood clots that may have been found in the veins of your legs (deep vein thrombosis) or in your lungs (pulmonary embolism) and to reduce the risk of them occurring again.  What do you need to know about Xarelto? The starting dose is one 15 mg tablet taken TWICE daily with food for the FIRST 21 DAYS then on (enter date)  12/08/18  the dose is changed to one 20 mg tablet taken ONCE A DAY with your evening meal.  DO NOT stop taking Xarelto without talking to the health care provider who prescribed the medication.  Refill your prescription for 20 mg tablets before you run out.  After discharge, you should have regular check-up appointments with your healthcare provider that is prescribing your Xarelto.  In the future your dose may need to be changed if your kidney function changes by a significant amount.  What do you do if you miss a dose? If you are taking Xarelto TWICE DAILY and you miss a dose, take it as soon as you remember. You may take two 15 mg tablets (total 30 mg) at the same time then resume your regularly scheduled 15 mg twice daily the next day.  If you are taking Xarelto ONCE DAILY and you miss a dose, take it as soon as you remember on the same day then continue your regularly scheduled once daily regimen the next day. Do not take two doses of Xarelto at the same time.   Important Safety Information Xarelto is a blood thinner medicine that can cause bleeding. You should call your healthcare provider right away if you experience any of the following: ? Bleeding from an injury or your nose that does not stop. ? Unusual colored urine (red or dark brown) or  unusual colored stools (red or black). ? Unusual bruising for unknown reasons. ? A serious fall or if you hit your head (even if there is no bleeding).  Some medicines may interact with Xarelto and might increase your risk of bleeding while on Xarelto. To help avoid this, consult your healthcare provider or pharmacist prior to using any new prescription or non-prescription medications, including herbals, vitamins, non-steroidal anti-inflammatory drugs (NSAIDs) and supplements.  This website has more information on Xarelto: [email or web address]-benson.[email or web address]

## 2018-11-17 NOTE — Progress Notes (Signed)
ANTICOAGULATION CONSULT NOTE - Follow Up Consult  Pharmacy Consult for heparin Indication: pulmonary embolus  No Known Allergies  Patient Measurements: Height: 5\' 8"  (172.7 cm) Weight: 222 lb 9.6 oz (101 kg) IBW/kg (Calculated) : 68.4 Heparin Dosing Weight: 89  Vital Signs: Temp: 98.2 F (36.8 C) (10/09 0504) Temp Source: Oral (10/09 0504) BP: 157/106 (10/09 0504) Pulse Rate: 83 (10/08 2002)  Labs: Recent Labs    11/16/18 0313 11/16/18 0540 11/16/18 1322 11/16/18 2210 11/17/18 0450  HGB 12.6*  --   --   --  13.3  HCT 37.1*  --   --   --  37.6*  PLT 211  --   --   --  219  HEPARINUNFRC  --   --  1.04* 0.80* 0.62  CREATININE 1.53*  --   --   --  1.32*  TROPONINIHS 4 4  --   --  4    Estimated Creatinine Clearance: 76.2 mL/min (A) (by C-G formula based on SCr of 1.32 mg/dL (H)).   Medications:  Medications Prior to Admission  Medication Sig Dispense Refill Last Dose  . ALBUTEROL IN Inhale into the lungs.     . cyclobenzaprine (FLEXERIL) 10 MG tablet Take 1 tablet (10 mg total) by mouth 2 (two) times daily as needed for muscle spasms. 20 tablet 0   . HYDROcodone-acetaminophen (NORCO/VICODIN) 5-325 MG tablet Take 1-2 tablets by mouth every 4 (four) hours as needed. 10 tablet 0   . naproxen (NAPROSYN) 500 MG tablet Take 1 tablet (500 mg total) by mouth 2 (two) times daily. 30 tablet 0   . Olmesartan-Amlodipine-HCTZ (TRIBENZOR PO) Take by mouth.     . tizanidine (ZANAFLEX) 2 MG capsule Take 1 capsule (2 mg total) by mouth 3 (three) times daily. 20 capsule 0     Assessment: 89 yoM admitted with SOB found to have PE. CBC stable, heparin therapeutic, trops negative.    Goal of Therapy:  Heparin level 0.3-0.7 units/ml Monitor platelets by anticoagulation protocol: Yes   Plan:  Continue heparin 1150 units/hr Daily heparin level and CBC   Arrie Senate, PharmD, BCPS Clinical Pharmacist [phone] Please check AMION for all [facility] numbers 11/17/2018

## 2018-11-17 NOTE — Discharge Summary (Addendum)
Physician Discharge Summary  Stephen Castillo BTD:[id number] DOB: 01-12-1966 DOA: 11/16/2018  PCP: Patient, No Pcp Per  Admit date: 11/16/2018 Discharge date: 11/17/2018  Time spent: 45 minutes  Recommendations for Outpatient Follow-up:  Follow up with PCP 2-3 weeks for evaluation of BP control Recommend BMET to evaluate kidney function Take medications as prescribed Recommend TED hose while at work   Discharge Diagnoses:  Principal Problem:   Single subsegmental pulmonary embolism without acute cor pulmonale (HCC) Active Problems:   Essential hypertension   Pulmonary embolism (HCC)   CKD (chronic kidney disease) stage 3, GFR 30-59 ml/min   Discharge Condition: stable  Diet recommendation: heart healthy  Filed Weights   11/16/18 0249 11/16/18 1849 11/17/18 0504  Weight: 99.3 kg 101.6 kg 101 kg    History of present illness:  Stephen Castillo is a 53 y.o. male with history of hypertension, chronic kidney disease, chronic anemia, asthma started experiencing left-sided chest pain and shortness of breath over the 2 days.  Chest pain  like pleuritic in nature present even at rest.  Did not have any fever chills or productive cough.  Given the persistent symptoms patient presented to the ER 10/8 at [facility].  Patient stated he had been having lower extremity edema for last few months. Hospital Course:   Pulmonary embolism unprovoked hemodynamically stable -CT scan does not show any right heart strain.  Troponins are negative. Dopplers of the lower extremity negative. Heparin gtt started and transitioned to xarelto Recommend TED hose while at work job requires standing stationary for long periods. Hypertension. Only fair control. Formerly on ARB, amlodipine and HCTZ combo. Developed gout. Changed to amlodipine and losartan and he reports BP not as well controlled. Was also started on Allopurinol at that time. Will continue allopurinol and resume HCTZ and continue amlodipine and  losartan. Needs to follow up closely with PCP for evaluation of BP control History of gout on allopurinol. History of asthma on PRN albuterol. No wheezing. Chronic kidney disease stage III creatinine appears to be at baseline when compared to the old labs in 2018 in care everywhere. Chronic anemia appears to have a hemoglobin at baseline when compared to old labs in care everywhere Procedures:  Consultations:   Discharge Exam: Vitals:   11/17/18 0504 11/17/18 0858  BP: (!) 157/106 (!) 154/95  Pulse:    Resp: 18   Temp: 98.2 F (36.8 C)   SpO2: 97%     General: awake alert no acute distress Cardiovascular: rrr no mgr no LE edema Respiratory: normal effort BS clear bilaterally no wheeze  Discharge Instructions   Discharge Instructions     Diet - low sodium heart healthy   Complete by: As directed    Discharge instructions   Complete by: As directed    Would wear compression hose when standing up for long periods of time   Increase activity slowly   Complete by: As directed       Allergies as of 11/17/2018   No Known Allergies      Medication List     STOP taking these medications    cyclobenzaprine 10 MG tablet Commonly known as: FLEXERIL   HYDROcodone-acetaminophen 5-325 MG tablet Commonly known as: NORCO/VICODIN   naproxen 500 MG tablet Commonly known as: NAPROSYN   tizanidine 2 MG capsule Commonly known as: Zanaflex   TRIBENZOR PO       TAKE these medications    ALBUTEROL IN Inhale into the lungs.   allopurinol 300 MG  tablet Commonly known as: ZYLOPRIM Take 1 tablet (300 mg total) by mouth at bedtime.   amLODipine 10 MG tablet Commonly known as: NORVASC Take 1 tablet (10 mg total) by mouth daily. Start taking on: November 18, 2018   hydrochlorothiazide 12.5 MG capsule Commonly known as: MICROZIDE Take 1 capsule (12.5 mg total) by mouth daily.   losartan 100 MG tablet Commonly known as: COZAAR Take 1 tablet (100 mg total) by mouth  at bedtime.   Rivaroxaban 15 & 20 MG Tbpk Follow package directions: Take one 15mg  tablet by mouth twice a day. On day 22, switch to one 20mg  tablet once a day. Take with food.       No Known Allergies     The results of significant diagnostics from this hospitalization (including imaging, microbiology, ancillary and laboratory) are listed below for reference.    Significant Diagnostic Studies: Dg Chest 2 View  Result Date: 11/16/2018 CLINICAL DATA:  Chest pain EXAM: CHEST - 2 VIEW COMPARISON:  None. FINDINGS: The heart size and mediastinal contours are within normal limits. Both lungs are clear. The visualized skeletal structures are unremarkable. IMPRESSION: No active cardiopulmonary disease. Electronically Signed   By: Donavan Foil M.D.   On: 11/16/2018 03:26   Ct Angio Chest Pe W And/or Wo Contrast  Result Date: 11/16/2018 CLINICAL DATA:  Pulmonary embolism suspected. Positive D-dimer with chest tightness EXAM: CT ANGIOGRAPHY CHEST WITH CONTRAST TECHNIQUE: Multidetector CT imaging of the chest was performed using the standard protocol during bolus administration of intravenous contrast. Multiplanar CT image reconstructions and MIPs were obtained to evaluate the vascular anatomy. CONTRAST:  19mL OMNIPAQUE IOHEXOL 350 MG/ML SOLN COMPARISON:  None. FINDINGS: Cardiovascular: Single branching pulmonary artery filling defect within a left lower lobe segment, nonocclusive. Normal heart size. No pericardial effusion. Negative aorta Mediastinum/Nodes: Negative for adenopathy or mass Lungs/Pleura: There is no edema, consolidation, effusion, or pneumothorax. There are a few pulmonary cysts that are nonspecific. Borderline generalized airway thickening. Upper Abdomen: Probable hepatic steatosis based on sparing at the gallbladder fossa. Musculoskeletal: No acute or aggressive finding Review of the MIP images confirms the above findings. IMPRESSION: Single nonocclusive segmental pulmonary embolism in  the left lower lobe. Electronically Signed   By: Monte Fantasia M.D.   On: 11/16/2018 05:29   Vas [place] Lower Extremity Venous (dvt)  Result Date: 11/17/2018  Lower Venous Study Indications: Positive PE.  Comparison Study: No priors. Performing Technologist: Oda Cogan RDMS, RVT  Examination Guidelines: A complete evaluation includes B-mode imaging, spectral Doppler, color Doppler, and power Doppler as needed of all accessible portions of each vessel. Bilateral testing is considered an integral part of a complete examination. Limited examinations for reoccurring indications may be performed as noted.  +---------+---------------+---------+-----------+----------+--------------+ RIGHT    CompressibilityPhasicitySpontaneityPropertiesThrombus Aging +---------+---------------+---------+-----------+----------+--------------+ CFV      Full           Yes      Yes                                 +---------+---------------+---------+-----------+----------+--------------+ SFJ      Full                                                        +---------+---------------+---------+-----------+----------+--------------+ FV Prox  Full                                                        +---------+---------------+---------+-----------+----------+--------------+  FV Mid   Full                                                        +---------+---------------+---------+-----------+----------+--------------+ FV DistalFull                                                        +---------+---------------+---------+-----------+----------+--------------+ PFV      Full                                                        +---------+---------------+---------+-----------+----------+--------------+ POP      Full           Yes      Yes                                 +---------+---------------+---------+-----------+----------+--------------+ PTV      Full                                                         +---------+---------------+---------+-----------+----------+--------------+ PERO     Full                                                        +---------+---------------+---------+-----------+----------+--------------+   +---------+---------------+---------+-----------+----------+--------------+ LEFT     CompressibilityPhasicitySpontaneityPropertiesThrombus Aging +---------+---------------+---------+-----------+----------+--------------+ CFV      Full           Yes      Yes                                 +---------+---------------+---------+-----------+----------+--------------+ SFJ      Full                                                        +---------+---------------+---------+-----------+----------+--------------+ FV Prox  Full                                                        +---------+---------------+---------+-----------+----------+--------------+ FV Mid   Full                                                        +---------+---------------+---------+-----------+----------+--------------+  FV DistalFull                                                        +---------+---------------+---------+-----------+----------+--------------+ PFV      Full                                                        +---------+---------------+---------+-----------+----------+--------------+ POP      Full           Yes      Yes                                 +---------+---------------+---------+-----------+----------+--------------+ PTV      Full                                                        +---------+---------------+---------+-----------+----------+--------------+ PERO     Full                                                        +---------+---------------+---------+-----------+----------+--------------+     Summary: Right: There is no evidence of deep vein thrombosis in the lower extremity.  No cystic structure found in the popliteal fossa. Left: There is no evidence of deep vein thrombosis in the lower extremity. No cystic structure found in the popliteal fossa.  *See table(s) above for measurements and observations.    Preliminary     Microbiology: Recent Results (from the past 240 hour(s))  SARS CORONAVIRUS 2 (TAT 6-24 HRS) Nasopharyngeal Nasopharyngeal Swab     Status: None   Collection Time: 11/16/18  8:11 AM   Specimen: Nasopharyngeal Swab  Result Value Ref Range Status   SARS Coronavirus 2 NEGATIVE NEGATIVE Final    Comment: (NOTE) SARS-CoV-2 target nucleic acids are NOT DETECTED. The SARS-CoV-2 RNA is generally detectable in upper and lower respiratory specimens during the acute phase of infection. Negative results do not preclude SARS-CoV-2 infection, do not rule out co-infections with other pathogens, and should not be used as the sole basis for treatment or other patient management decisions. Negative results must be combined with clinical observations, patient history, and epidemiological information. The expected result is Negative. Fact Sheet for Patients: HairSlick.no Fact Sheet for Healthcare Providers: quierodirigir.com This test is not yet approved or cleared by the [place] FDA and  has been authorized for detection and/or diagnosis of SARS-CoV-2 by FDA under an Emergency Use Authorization (EUA). This EUA will remain  in effect (meaning this test can be used) for the duration of the COVID-19 declaration under Section 56 4(b)(1) of the Act, 21 U.S.C. section 360bbb-3(b)(1), unless the authorization is terminated or revoked sooner. Performed at [facility], [address], [place], [place] [address]      Labs: Basic Metabolic  Panel: Recent Labs  Lab 11/16/18 0313 11/17/18 0450  NA 140 141  K 3.5 3.9  CL 104 103  CO2 27 26  GLUCOSE 139* 133*  BUN 18 11  CREATININE 1.53* 1.32*   CALCIUM 8.7* 8.8*   Liver Function Tests: Recent Labs  Lab 11/16/18 0313  AST 23  ALT 29  ALKPHOS 71  BILITOT 0.4  PROT 7.2  ALBUMIN 4.2   Recent Labs  Lab 11/16/18 0313  LIPASE 56*   No results for input(s): AMMONIA in the last 168 hours. CBC: Recent Labs  Lab 11/16/18 0313 11/17/18 0450  WBC 6.8 5.9  NEUTROABS 3.6  --   HGB 12.6* 13.3  HCT 37.1* 37.6*  MCV 91.4 89.7  PLT 211 219   Cardiac Enzymes: No results for input(s): CKTOTAL, CKMB, CKMBINDEX, TROPONINI in the last 168 hours. BNP: BNP (last 3 results) Recent Labs    11/16/18 0540  BNP 13.9    ProBNP (last 3 results) No results for input(s): PROBNP in the last 8760 hours.  CBG: No results for input(s): GLUCAP in the last 168 hours.     SignedGwenyth Bender:  Francesco Provencal M NP [facility] 11/17/2018, 1:41 PM

## 2018-11-17 NOTE — TOC Initial Note (Addendum)
Transition of Care [facility]) - Initial/Assessment Note    Patient Details  Name: Stephen Castillo MRN: [id number] Date of Birth: [birth year]  [facility]) CM/SW Contact:    Bartholomew Crews, RN Phone Number: [phone] 11/17/2018, 1:50 PM  Clinical Narrative:                 Received consult to assist patient with medications and PCP. Scheduled hospital follow up appointment at Patient [facility] for 10/16 at 9:20am. Medications sent to [place] [place] - match not needed - patient cost $4. Will provide application for patient assistance for [place] pharmacy used the free trial card to fill today's prescription. Following for transition of care needs.   Update: Spoke with patient at the bedside. He was appreciative of information and accepted the application. Advised to complete and take to his f/u appointment next week.   Expected Discharge Plan: Home/Self Care Barriers to Discharge: No Barriers Identified   Patient Goals and CMS Choice Patient states their goals for this hospitalization and ongoing recovery are:: home CMS Medicare.gov Compare Post Acute Care list provided to:: Patient Choice offered to / list presented to : NA  Expected Discharge Plan and Services Expected Discharge Plan: Home/Self Care In-house Referral: PCP / [facility] Discharge Planning Services: CM Consult Post Acute Care Choice: NA Living arrangements for the past 2 months: Single Family Home Expected Discharge Date: 11/17/18               DME Arranged: N/A DME Agency: NA       HH Arranged: NA HH Agency: NA        Prior Living Arrangements/Services Living arrangements for the past 2 months: Single Family Home Lives with:: Self, Spouse                   Activities of Daily Living Home Assistive Devices/Equipment: None ADL Screening (condition at time of admission) Patient's cognitive ability adequate to safely complete daily activities?: Yes Is the patient deaf or have difficulty  hearing?: No Does the patient have difficulty seeing, even when wearing glasses/contacts?: No Does the patient have difficulty concentrating, remembering, or making decisions?: No Patient able to express need for assistance with ADLs?: Yes Does the patient have difficulty dressing or bathing?: No Independently performs ADLs?: Yes (appropriate for developmental age) Does the patient have difficulty walking or climbing stairs?: No Weakness of Legs: None Weakness of Arms/Hands: None  Permission Sought/Granted                  Emotional Assessment              Admission diagnosis:  Single subsegmental pulmonary embolism without acute cor pulmonale ([place]) [I26.93] Patient Active Problem List   Diagnosis Date Noted  . Single subsegmental pulmonary embolism without acute cor pulmonale ([place]) 11/16/2018  . Essential hypertension 11/16/2018  . Pulmonary embolism ([place]) 11/16/2018  . CKD (chronic kidney disease) stage 3, GFR 30-59 ml/min 11/16/2018   PCP:  Patient, No Pcp Per Pharmacy:   [facility] [place] [place] [address] [place] [address] Phone: [phone] Fax: [phone]     Social Determinants of Health ([place]) Interventions    Readmission Risk Interventions No flowsheet data found.

## 2018-11-17 NOTE — Plan of Care (Signed)
  Problem: Education: Goal: Knowledge of General Education information will improve Description: Including pain rating scale, medication(s)/side effects and non-pharmacologic comfort measures Outcome: Progressing   Problem: Clinical Measurements: Goal: Ability to maintain clinical measurements within normal limits will improve Outcome: Progressing Goal: Diagnostic test results will improve Outcome: Progressing   Problem: Activity: Goal: Risk for activity intolerance will decrease Outcome: Progressing   Problem: Pain Managment: Goal: General experience of comfort will improve Outcome: Progressing   

## 2018-11-17 NOTE — Progress Notes (Signed)
Venous duplex lower ext  has been completed. Refer to [facility] under chart review to view preliminary results.   11/17/2018  11:28 AM Devonta Blanford, Bonnye Fava

## 2018-11-24 ENCOUNTER — Encounter: Payer: Self-pay | Admitting: Family Medicine

## 2018-11-24 ENCOUNTER — Ambulatory Visit (INDEPENDENT_AMBULATORY_CARE_PROVIDER_SITE_OTHER): Payer: Self-pay | Admitting: Family Medicine

## 2018-11-24 ENCOUNTER — Other Ambulatory Visit: Payer: Self-pay

## 2018-11-24 VITALS — BP 132/84 | HR 76 | Temp 97.8°F | Ht 68.0 in | Wt 224.2 lb

## 2018-11-24 DIAGNOSIS — Z09 Encounter for follow-up examination after completed treatment for conditions other than malignant neoplasm: Secondary | ICD-10-CM

## 2018-11-24 DIAGNOSIS — Z7689 Persons encountering health services in other specified circumstances: Secondary | ICD-10-CM

## 2018-11-24 DIAGNOSIS — R0602 Shortness of breath: Secondary | ICD-10-CM

## 2018-11-24 DIAGNOSIS — Z86711 Personal history of pulmonary embolism: Secondary | ICD-10-CM

## 2018-11-24 DIAGNOSIS — I10 Essential (primary) hypertension: Secondary | ICD-10-CM

## 2018-11-24 DIAGNOSIS — Z0000 Encounter for general adult medical examination without abnormal findings: Secondary | ICD-10-CM

## 2018-11-24 LAB — POCT URINALYSIS DIPSTICK
Bilirubin, UA: NEGATIVE
Glucose, UA: NEGATIVE
Ketones, UA: NEGATIVE
Leukocytes, UA: NEGATIVE
Nitrite, UA: NEGATIVE
Protein, UA: NEGATIVE
Spec Grav, UA: >=1.03 — AB (ref 1.010–1.025)
Urobilinogen, UA: NEGATIVE E.U./dL — AB
pH, UA: 5.5 (ref 5.0–8.0)

## 2018-11-24 LAB — POCT GLYCOSYLATED HEMOGLOBIN (HGB A1C): Hemoglobin A1C: 6.1 % — AB (ref 4.0–5.6)

## 2018-11-24 LAB — GLUCOSE, POCT (MANUAL RESULT ENTRY): POC Glucose: 133 mg/dl — AB (ref 70–99)

## 2018-11-24 MED ORDER — HYDROCHLOROTHIAZIDE 12.5 MG PO CAPS: 12.5000 mg | ORAL_CAPSULE | Freq: Every day | ORAL | 3 refills | Status: DC

## 2018-11-24 MED ORDER — ALLOPURINOL 300 MG PO TABS: 300.0000 mg | ORAL_TABLET | Freq: Every day | ORAL | 3 refills | Status: DC

## 2018-11-24 MED ORDER — LOSARTAN POTASSIUM 100 MG PO TABS: 100.0000 mg | ORAL_TABLET | Freq: Every day | ORAL | 3 refills | Status: DC

## 2018-11-24 MED ORDER — AMLODIPINE BESYLATE 10 MG PO TABS: 10.0000 mg | ORAL_TABLET | Freq: Every day | ORAL | 3 refills | Status: DC

## 2018-11-24 MED ORDER — RIVAROXABAN 20 MG PO TABS: 20.0000 mg | ORAL_TABLET | Freq: Every day | ORAL | 3 refills | Status: DC

## 2018-11-24 MED ORDER — ALBUTEROL SULFATE HFA 108 (90 BASE) MCG/ACT IN AERS: 2.0000 | INHALATION_SPRAY | Freq: Four times a day (QID) | RESPIRATORY_TRACT | 12 refills | Status: DC | PRN

## 2018-11-24 MED FILL — ALBUTEROL SULFATE HFA 108 (: 108 (90 BAS | 25 days supply | Qty: 18 | Fill #0

## 2018-11-24 NOTE — Patient Instructions (Signed)
Pulmonary Embolism  A pulmonary embolism (PE) is a sudden blockage or decrease of blood flow in one or both lungs. Most blockages come from a blood clot that forms in the vein of a lower leg, thigh, or arm (deep vein thrombosis, DVT) and travels to the lungs. A clot is blood that has thickened into a gel or solid. PE is a dangerous and life-threatening condition that needs to be treated right away. What are the causes? This condition is usually caused by a blood clot that forms in a vein and moves to the lungs. In rare cases, it may be caused by air, fat, part of a tumor, or other tissue that moves through the veins and into the lungs. What increases the risk? The following factors may make you more likely to develop this condition:  Experiencing a traumatic injury, such as breaking a hip or leg.  Having: ? A spinal cord injury. ? Orthopedic surgery, especially hip or knee replacement. ? Any major surgery. ? A stroke. ? DVT. ? Blood clots or blood clotting disease. ? Long-term (chronic) lung or heart disease. ? Cancer treated with chemotherapy. ? A central venous catheter.  Taking medicines that contain estrogen. These include birth control pills and hormone replacement therapy.  Being: ? Pregnant. ? In the period of time after your baby is delivered (postpartum). ? Older than age 60. ? Overweight. ? A smoker, especially if you have other risks. What are the signs or symptoms? Symptoms of this condition usually start suddenly and include:  Shortness of breath during activity or at rest.  Coughing, coughing up blood, or coughing up blood-tinged mucus.  Chest pain that is often worse with deep breaths.  Rapid or irregular heartbeat.  Feeling light-headed or dizzy.  Fainting.  Feeling anxious.  Fever.  Sweating.  Pain and swelling in a leg. This is a symptom of DVT, which can lead to PE. How is this diagnosed? This condition may be diagnosed based on:  Your medical  history.  A physical exam.  Blood tests.  CT pulmonary angiogram. This test checks blood flow in and around your lungs.  Ventilation-perfusion scan, also called a lung VQ scan. This test measures air flow and blood flow to the lungs.  An ultrasound of the legs. How is this treated? Treatment for this condition depends on many factors, such as the cause of your PE, your risk for bleeding or developing more clots, and other medical conditions you have. Treatment aims to remove, dissolve, or stop blood clots from forming or growing larger. Treatment may include:  Medicines, such as: ? Blood thinning medicines (anticoagulants) to stop clots from forming. ? Medicines that dissolve clots (thrombolytics).  Procedures, such as: ? Using a flexible tube to remove a blood clot (embolectomy) or to deliver medicine to destroy it (catheter-directed thrombolysis). ? Inserting a filter into a large vein that carries blood to the heart (inferior vena cava). This filter (vena cava filter) catches blood clots before they reach the lungs. ? Surgery to remove the clot (surgical embolectomy). This is rare. You may need a combination of immediate, long-term (up to 3 months after diagnosis), and extended (more than 3 months after diagnosis) treatments. Your treatment may continue for several months (maintenance therapy). You and your health care provider will work together to choose the treatment program that is best for you. Follow these instructions at home: Medicines  Take over-the-counter and prescription medicines only as told by your health care provider.  If you   are taking an anticoagulant medicine: ? Take the medicine every day at the same time each day. ? Understand what foods and drugs interact with your medicine. ? Understand the side effects of this medicine, including excessive bruising or bleeding. Ask your health care provider or pharmacist about other side effects. General instructions   Wear a medical alert bracelet or carry a medical alert card that says you have had a PE and lists what medicines you take.  Ask your health care provider when you may return to your normal activities. Avoid sitting or lying for a long time without moving.  Maintain a healthy weight. Ask your health care provider what weight is healthy for you.  Do not use any products that contain nicotine or tobacco, such as cigarettes, e-cigarettes, and chewing tobacco. If you need help quitting, ask your health care provider.  Talk with your health care provider about any travel plans. It is important to make sure that you are still able to take your medicine while on trips.  Keep all follow-up visits as told by your health care provider. This is important. Contact a health care provider if:  You missed a dose of your blood thinner medicine. Get help right away if:  You have: ? New or increased pain, swelling, warmth, or redness in an arm or leg. ? Numbness or tingling in an arm or leg. ? Shortness of breath during activity or at rest. ? A fever. ? Chest pain. ? A rapid or irregular heartbeat. ? A severe headache. ? Vision changes. ? A serious fall or accident, or you hit your head. ? Stomach (abdominal) pain. ? Blood in your vomit, stool, or urine. ? A cut that will not stop bleeding.  You cough up blood.  You feel light-headed or dizzy.  You cannot move your arms or legs.  You are confused or have memory loss. These symptoms may represent a serious problem that is an emergency. Do not wait to see if the symptoms will go away. Get medical help right away. Call your local emergency services (911 in the [place] Do not drive yourself to the hospital. Summary  A pulmonary embolism (PE) is a sudden blockage or decrease of blood flow in one or both lungs. PE is a dangerous and life-threatening condition that needs to be treated right away.  Treatments for this condition usually include  medicines to thin your blood (anticoagulants) or medicines to break apart blood clots (thrombolytics).  If you are given blood thinners, it is important to take the medicine every day at the same time each day.  Understand what foods and drugs interact with any medicines that you are taking.  If you have signs of PE or DVT, call your local emergency services (911 in the [place] This information is not intended to replace advice given to you by your health care provider. Make sure you discuss any questions you have with your health care provider. Document Released: 01/23/2000 Document Revised: 11/02/2017 Document Reviewed: 11/02/2017 Elsevier Patient Education  2020 [employer]  

## 2018-11-24 NOTE — Progress Notes (Signed)
[facility]   Hospital Follow--New Patient--Establish Care  Subjective:  Patient ID: Stephen Castillo, male    DOB: May 31, 1965  Age: 53 y.o. MRN: [id number]  CC:  Chief Complaint  Patient presents with  . Hospitalization Follow-up    Discharged on 11/16/2018  . Establish Care    HTN, PE    HPI Stephen Castillo is a 53 year old male who presents for Hospital Follow Up and to Establish Care today.   Past Medical History:  Diagnosis Date  . Asthma   . Hypertension    Current Status: This will be Stephen Castillo initial office visit with me. He was previously loss to follow up by a physician for his PCP needs. Since his last office visit, he was recently diagnosed with PE and is doing well with no complaints. Denies sudden onset of dyspnea and coughing. Denies Productive frothy, pink-tinged sputum. Denies tachycardia, pallor, feelings of impending doom. Denies symptoms of DVT, history of A-fib, any recent surgeries, pregnancy, long-bone fractures, and prolonged inactivity (sedentary, long distant traveling).  He denies fevers, chills, fatigue, recent infections, weight loss, and night sweats. He has not had any headaches, visual changes, dizziness, and falls. No chest pain, heart palpitations, cough and shortness of breath reported. No reports of GI problems such as nausea, vomiting, diarrhea, and constipation. He has no reports of blood in stools, dysuria and hematuria. No depression or anxiety, and denies suicidal ideations, homicidal ideations, or auditory hallucinations. He denies pain today.   History reviewed. No pertinent surgical history.  History reviewed. No pertinent family history.  Social History   Socioeconomic History  . Marital status: Married    Spouse name: Not on file  . Number of children: Not on file  . Years of education: Not on file  . Highest education level: Not on file  Occupational History  . Not on file  Social  Needs  . Financial resource strain: Not on file  . Food insecurity    Worry: Not on file    Inability: Not on file  . Transportation needs    Medical: Not on file    Non-medical: Not on file  Tobacco Use  . Smoking status: Never Smoker  . Smokeless tobacco: Never Used  Substance and Sexual Activity  . Alcohol use: No  . Drug use: No  . Sexual activity: Not on file  Lifestyle  . Physical activity    Days per week: Not on file    Minutes per session: Not on file  . Stress: Not on file  Relationships  . Social Musician on phone: Not on file    Gets together: Not on file    Attends religious service: Not on file    Active member of club or organization: Not on file    Attends meetings of clubs or organizations: Not on file    Relationship status: Not on file  . Intimate partner violence    Fear of current or ex partner: Not on file    Emotionally abused: Not on file    Physically abused: Not on file    Forced sexual activity: Not on file  Other Topics Concern  . Not on file  Social History Narrative  . Not on file    Outpatient Medications Prior to Visit  Medication Sig Dispense Refill  . ALBUTEROL IN Inhale into the lungs.    Marland Kitchen allopurinol (ZYLOPRIM) 300 MG tablet Take 1 tablet (  300 mg total) by mouth at bedtime. 30 tablet 0  . amLODipine (NORVASC) 10 MG tablet Take 1 tablet (10 mg total) by mouth daily. 30 tablet 1  . hydrochlorothiazide (MICROZIDE) 12.5 MG capsule Take 1 capsule (12.5 mg total) by mouth daily. 30 capsule 1  . losartan (COZAAR) 100 MG tablet Take 1 tablet (100 mg total) by mouth at bedtime. 30 tablet 0  . Rivaroxaban 15 & 20 MG TBPK Follow package directions: Take one  tablet by mouth twice a day. On day 22, switch to one  tablet once a day. Take with food. 51 each 0   No facility-administered medications prior to visit.     No Known Allergies  ROS Review of Systems  Constitutional: Negative.   HENT: Negative.   Eyes:  Negative.   Respiratory: Positive for shortness of breath (occasional).   Cardiovascular: Negative.   Gastrointestinal: Negative.   Endocrine: Negative.   Genitourinary: Negative.   Musculoskeletal: Negative.   Skin: Negative.   Allergic/Immunologic: Negative.   Neurological: Positive for dizziness (occasional) and headaches (occasional).  Hematological: Negative.   Psychiatric/Behavioral: Negative.       Objective:    Physical Exam  Constitutional: He is oriented to person, place, and time. He appears well-developed and well-nourished.  HENT:  Head: Normocephalic and atraumatic.  Eyes: Conjunctivae are normal.  Neck: Normal range of motion. Neck supple.  Cardiovascular: Normal rate, regular rhythm, normal heart sounds and intact distal pulses.  Pulmonary/Chest: Effort normal and breath sounds normal.  Abdominal: Soft. Bowel sounds are normal.  Musculoskeletal: Normal range of motion.  Neurological: He is alert and oriented to person, place, and time. He has normal reflexes.  Skin: Skin is warm and dry.  Psychiatric: He has a normal mood and affect. His behavior is normal. Judgment and thought content normal.  Nursing note and vitals reviewed.   BP 132/84 (BP Location: Left Arm, Patient Position: Sitting, Cuff Size: Large)   Pulse 76   Temp 97.8 F (36.6 C) (Oral)   Ht  (1.727 m)   Wt 224 lb 3.2 oz (101.7 kg)   SpO2 95%   BMI 34.09 kg/m  Wt Readings from Last 3 Encounters:  11/24/18 224 lb 3.2 oz (101.7 kg)  11/17/18 222 lb 9.6 oz (101 kg)  03/17/15 218 lb (98.9 kg)     Health Maintenance Due  Topic Date Due  . TETANUS/TDAP  05/15/1986  . COLONOSCOPY  05/14/2017    There are no preventive care reminders to display for this patient.  Lab Results  Component Value Date   TSH 3.370 11/24/2018   Lab Results  Component Value Date   WBC 5.9 11/17/2018   HGB 13.3 11/17/2018   HCT 37.6 (L) 11/17/2018   MCV 89.7 11/17/2018   PLT 219 11/17/2018   Lab  Results  Component Value Date   NA 141 11/17/2018   K 3.9 11/17/2018   CO2 26 11/17/2018   GLUCOSE 133 (H) 11/17/2018   BUN 11 11/17/2018   CREATININE 1.32 (H) 11/17/2018   BILITOT 0.4 11/16/2018   ALKPHOS 71 11/16/2018   AST 23 11/16/2018   ALT 29 11/16/2018   PROT 7.2 11/16/2018   ALBUMIN 4.2 11/16/2018   CALCIUM 8.8 (L) 11/17/2018   ANIONGAP 12 11/17/2018   Lab Results  Component Value Date   CHOL 200 (H) 11/24/2018   Lab Results  Component Value Date   HDL 37 (L) 11/24/2018   Lab Results  Component Value Date  LDLCALC 133 (H) 11/24/2018   Lab Results  Component Value Date   TRIG 166 (H) 11/24/2018   Lab Results  Component Value Date   CHOLHDL 5.4 (H) 11/24/2018   Lab Results  Component Value Date   HGBA1C 6.1 (A) 11/24/2018      Assessment & Plan:   1. Hospital discharge follow-up  2. Encounter to establish care  3. Essential hypertension The current medical regimen is effective; blood pressure is stable at 132/84 today; continue present plan and medications as prescribed. He will continue to take medications as prescribed, to decrease high sodium intake, excessive alcohol intake, increase potassium intake, smoking cessation, and increase physical activity of at least 30 minutes of cardio activity daily. He will continue to follow Heart Healthy or DASH diet. - POCT glucose (manual entry) - POCT glycosylated hemoglobin (Hb A1C) - Lipid Panel - TSH - PSA - Vitamin D, 25-hydroxy - Vitamin B12 - allopurinol (ZYLOPRIM) 300 MG tablet; Take 1 tablet (300 mg total) by mouth at bedtime.  Dispense: 30 tablet; Refill: 3 - amLODipine (NORVASC) 10 MG tablet; Take 1 tablet (10 mg total) by mouth daily.  Dispense: 30 tablet; Refill: 3 - hydrochlorothiazide (MICROZIDE) 12.5 MG capsule; Take 1 capsule (12.5 mg total) by mouth daily.  Dispense: 30 capsule; Refill: 3 - losartan (COZAAR) 100 MG tablet; Take 1 tablet (100 mg total) by mouth at bedtime.  Dispense: 30  tablet; Refill: 3  4. Shortness of breath We will initiate inhaler today.  - albuterol (VENTOLIN HFA) 108 (90 Base) MCG/ACT inhaler; Inhale 2 puffs into the lungs every 6 (six) hours as needed for wheezing or shortness of breath.  Dispense: 8 g; Refill: 12  5. History of pulmonary embolism He will continue blood thinner as prescribed. He denies signs and symptoms of recurrence of PE and DVT.  - rivaroxaban (XARELTO) 20 MG TABS tablet; Take 1 tablet (20 mg total) by mouth daily with supper.  Dispense: 30 tablet; Refill: 3  6. Healthcare maintenance - POCT urinalysis dipstick - Lipid Panel - TSH - PSA - Vitamin D, 25-hydroxy - Vitamin B12  7. Follow up He will follow up in 1 month.   Meds ordered this encounter  Medications  . albuterol (VENTOLIN HFA) 108 (90 Base) MCG/ACT inhaler    Sig: Inhale 2 puffs into the lungs every 6 (six) hours as needed for wheezing or shortness of breath.    Dispense:  8 g    Refill:  12  . allopurinol (ZYLOPRIM) 300 MG tablet    Sig: Take 1 tablet (300 mg total) by mouth at bedtime.    Dispense:  30 tablet    Refill:  3  . amLODipine (NORVASC) 10 MG tablet    Sig: Take 1 tablet (10 mg total) by mouth daily.    Dispense:  30 tablet    Refill:  3  . hydrochlorothiazide (MICROZIDE) 12.5 MG capsule    Sig: Take 1 capsule (12.5 mg total) by mouth daily.    Dispense:  30 capsule    Refill:  3  . losartan (COZAAR) 100 MG tablet    Sig: Take 1 tablet (100 mg total) by mouth at bedtime.    Dispense:  30 tablet    Refill:  3  . rivaroxaban (XARELTO) 20 MG TABS tablet    Sig: Take 1 tablet (20 mg total) by mouth daily with supper.    Dispense:  30 tablet    Refill:  3    Orders Placed  This Encounter  Procedures  . Lipid Panel  . TSH  . PSA  . Vitamin D, 25-hydroxy  . Vitamin B12  . POCT urinalysis dipstick  . POCT glucose (manual entry)  . POCT glycosylated hemoglobin (Hb A1C)    Referral Orders  No referral(s) requested today     Kathe Becton,  MSN, FNP-BC [place], [place] [address] [phone] fax   Problem List Items Addressed This Visit      Cardiovascular and Mediastinum   Essential hypertension   Relevant Medications   allopurinol (ZYLOPRIM) 300 MG tablet   amLODipine (NORVASC) 10 MG tablet   hydrochlorothiazide (MICROZIDE) 12.5 MG capsule   losartan (COZAAR) 100 MG tablet   rivaroxaban (XARELTO) 20 MG TABS tablet   Other Relevant Orders   POCT glucose (manual entry) (Completed)   POCT glycosylated hemoglobin (Hb A1C) (Completed)   Lipid Panel (Completed)   TSH (Completed)   PSA (Completed)   Vitamin D, 25-hydroxy (Completed)   Vitamin B12 (Completed)    Other Visit Diagnoses    Hospital discharge follow-up    -  Primary   Encounter to establish care       Shortness of breath       Relevant Medications   albuterol (VENTOLIN HFA) 108 (90 Base) MCG/ACT inhaler   History of pulmonary embolism       Relevant Medications   rivaroxaban (XARELTO) 20 MG TABS tablet   Healthcare maintenance       Relevant Orders   POCT urinalysis dipstick (Completed)   Lipid Panel (Completed)   TSH (Completed)   PSA (Completed)   Vitamin D, 25-hydroxy (Completed)   Vitamin B12 (Completed)   Follow up          Meds ordered this encounter  Medications  . albuterol (VENTOLIN HFA) 108 (90 Base) MCG/ACT inhaler    Sig: Inhale 2 puffs into the lungs every 6 (six) hours as needed for wheezing or shortness of breath.    Dispense:  8 g    Refill:  12  . allopurinol (ZYLOPRIM) 300 MG tablet    Sig: Take 1 tablet (300 mg total) by mouth at bedtime.    Dispense:  30 tablet    Refill:  3  . amLODipine (NORVASC) 10 MG tablet    Sig: Take 1 tablet (10 mg total) by mouth daily.    Dispense:  30 tablet    Refill:  3  . hydrochlorothiazide (MICROZIDE) 12.5 MG capsule    Sig: Take 1 capsule (12.5 mg total)  by mouth daily.    Dispense:  30 capsule    Refill:  3  . losartan (COZAAR) 100 MG tablet    Sig: Take 1 tablet (100 mg total) by mouth at bedtime.    Dispense:  30 tablet    Refill:  3  . rivaroxaban (XARELTO) 20 MG TABS tablet    Sig: Take 1 tablet (20 mg total) by mouth daily with supper.    Dispense:  30 tablet    Refill:  3    Follow-up: Return in about 1 month (around 12/25/2018).    Azzie Glatter, FNP

## 2018-11-26 DIAGNOSIS — Z86711 Personal history of pulmonary embolism: Secondary | ICD-10-CM | POA: Insufficient documentation

## 2018-11-26 DIAGNOSIS — R0602 Shortness of breath: Secondary | ICD-10-CM | POA: Insufficient documentation

## 2018-11-28 ENCOUNTER — Other Ambulatory Visit: Payer: Self-pay | Admitting: Family Medicine

## 2018-11-28 ENCOUNTER — Encounter: Payer: Self-pay | Admitting: Family Medicine

## 2018-11-28 DIAGNOSIS — E559 Vitamin D deficiency, unspecified: Secondary | ICD-10-CM

## 2018-11-28 MED ORDER — VITAMIN D (ERGOCALCIFEROL) 1.25 MG (50000 UNIT) PO CAPS: 50000.0000 [IU] | ORAL_CAPSULE | ORAL | 3 refills | Status: AC

## 2018-11-29 LAB — LIPID PANEL
Chol/HDL Ratio: 5.4 ratio — ABNORMAL HIGH (ref 0.0–5.0)
Cholesterol, Total: 200 mg/dL — ABNORMAL HIGH (ref 100–199)
HDL: 37 mg/dL — ABNORMAL LOW (ref >39)
LDL Chol Calc (NIH): 133 mg/dL — ABNORMAL HIGH (ref 0–99)
Triglycerides: 166 mg/dL — ABNORMAL HIGH (ref 0–149)
VLDL Cholesterol Cal: 30 mg/dL (ref 5–40)

## 2018-11-29 LAB — PSA: Prostate Specific Ag, Serum: 0.4 ng/mL (ref 0.0–4.0)

## 2018-11-29 LAB — VITAMIN B12: Vitamin B-12: 300 pg/mL (ref 232–1245)

## 2018-11-29 LAB — VITAMIN D 25 HYDROXY (VIT D DEFICIENCY, FRACTURES): Vit D, 25-Hydroxy: 19.3 ng/mL — ABNORMAL LOW (ref 30.0–100.0)

## 2018-11-29 LAB — TSH: TSH: 3.37 u[IU]/mL (ref 0.450–4.500)

## 2018-12-19 MED FILL — HYDROCHLOROTHIAZIDE 12.5 MG: 12.5 | 30 days supply | Qty: 30 | Fill #0

## 2018-12-19 MED FILL — LOSARTAN POTASSIUM 100 MG T: 100 | 30 days supply | Qty: 30 | Fill #0

## 2018-12-19 MED FILL — AMLODIPINE BESYLATE 10 MG T: 10 | 30 days supply | Qty: 30 | Fill #0

## 2018-12-19 MED FILL — ALBUTEROL SULFATE HFA 108 (: 108 (90 BAS | 25 days supply | Qty: 18 | Fill #1

## 2018-12-26 ENCOUNTER — Other Ambulatory Visit: Payer: Self-pay

## 2018-12-26 ENCOUNTER — Ambulatory Visit (INDEPENDENT_AMBULATORY_CARE_PROVIDER_SITE_OTHER): Payer: Self-pay | Admitting: Family Medicine

## 2018-12-26 ENCOUNTER — Encounter: Payer: Self-pay | Admitting: Family Medicine

## 2018-12-26 VITALS — BP 124/65 | HR 82 | Temp 97.6°F | Ht 68.0 in | Wt 227.6 lb

## 2018-12-26 DIAGNOSIS — I10 Essential (primary) hypertension: Secondary | ICD-10-CM

## 2018-12-26 DIAGNOSIS — Z0000 Encounter for general adult medical examination without abnormal findings: Secondary | ICD-10-CM

## 2018-12-26 DIAGNOSIS — Z09 Encounter for follow-up examination after completed treatment for conditions other than malignant neoplasm: Secondary | ICD-10-CM

## 2018-12-26 DIAGNOSIS — Z86711 Personal history of pulmonary embolism: Secondary | ICD-10-CM

## 2018-12-26 DIAGNOSIS — R0602 Shortness of breath: Secondary | ICD-10-CM

## 2018-12-26 LAB — POCT URINALYSIS DIPSTICK
Bilirubin, UA: NEGATIVE
Glucose, UA: NEGATIVE
Ketones, UA: NEGATIVE
Leukocytes, UA: NEGATIVE
Nitrite, UA: NEGATIVE
Protein, UA: NEGATIVE
Spec Grav, UA: 1.025 (ref 1.010–1.025)
Urobilinogen, UA: 0.2 E.U./dL
pH, UA: 6.5 (ref 5.0–8.0)

## 2018-12-26 MED FILL — XARELTO 20 MG TABLET: 20 | 30 days supply | Qty: 30 | Fill #0

## 2018-12-26 MED FILL — VIT D2 1.25 MG (50,000 UNIT: 1.25 MG | 35 days supply | Qty: 5 | Fill #0

## 2018-12-26 NOTE — Progress Notes (Signed)
[facility]   Established Patient Office Visit  Subjective:  Patient ID: Stephen Castillo, male    DOB: 1965-05-22  Age: 53 y.o. MRN: [id number]  CC:  Chief Complaint  Patient presents with  . Hospitalization Follow-up    Dicharged on 11/14/2018 PE  . Annual Exam  . Joint Swelling    Swelling in both ankles at the end of the day    HPI Stephen Castillo is a 53 year old male who presents for Follow Up today.  Past Medical History:  Diagnosis Date  . Asthma   . Hypertension   . Hypertension 11/2018  . Vitamin D deficiency 11/2018   Current Status: Since his last office visit, he states that he has been having mild, but noticeable left chest discomfort X 3 days ago, which he has been out of is [place] since then. Denies sudden onset of dyspnea and coughing. Denies Productive frothy, pink-tinged sputum. Denies tachycardia, pallor, feelings of impending doom. Denies symptoms of DVT, history of A-fib, any recent surgeries, pregnancy, long-bone fractures, and prolonged inactivity (sedentary, long distant traveling). He denies fevers, chills, fatigue, recent infections, weight loss, and night sweats. He has not had any headaches, visual changes, dizziness, and falls. No chest pain, heart palpitations, cough and shortness of breath reported. No reports of GI problems such as nausea, vomiting, diarrhea, and constipation. He has no reports of blood in stools, dysuria and hematuria. No depression or anxiety, and denies suicidal ideations, homicidal ideations, or auditory hallucinations. He denies pain today.   Past Surgical History:  Procedure Laterality Date  . KNEE ARTHROSCOPY Right     Family History  Problem Relation Age of Onset  . Hypercholesterolemia Mother   . Hypertension Mother   . Diabetes Father     Social History   Socioeconomic History  . Marital status: Married    Spouse name: Not on file  . Number of children: Not on file   . Years of education: Not on file  . Highest education level: Not on file  Occupational History  . Not on file  Social Needs  . Financial resource strain: Not on file  . Food insecurity    Worry: Not on file    Inability: Not on file  . Transportation needs    Medical: Not on file    Non-medical: Not on file  Tobacco Use  . Smoking status: Never Smoker  . Smokeless tobacco: Never Used  Substance and Sexual Activity  . Alcohol use: No  . Drug use: No  . Sexual activity: Yes    Partners: Female  Lifestyle  . Physical activity    Days per week: Not on file    Minutes per session: Not on file  . Stress: Not on file  Relationships  . Social Musician on phone: Not on file    Gets together: Not on file    Attends religious service: Not on file    Active member of club or organization: Not on file    Attends meetings of clubs or organizations: Not on file    Relationship status: Not on file  . Intimate partner violence    Fear of current or ex partner: Not on file    Emotionally abused: Not on file    Physically abused: Not on file    Forced sexual activity: Not on file  Other Topics Concern  . Not on file  Social History Narrative  .  Not on file    Outpatient Medications Prior to Visit  Medication Sig Dispense Refill  . albuterol (VENTOLIN HFA) 108 (90 Base) MCG/ACT inhaler Inhale 2 puffs into the lungs every 6 (six) hours as needed for wheezing or shortness of breath. 8 g 12  . ALBUTEROL IN Inhale into the lungs.    Marland Kitchen. allopurinol (ZYLOPRIM) 300 MG tablet Take 1 tablet (300 mg total) by mouth at bedtime. 30 tablet 3  . amLODipine (NORVASC) 10 MG tablet Take 1 tablet (10 mg total) by mouth daily. 30 tablet 3  . hydrochlorothiazide (MICROZIDE) 12.5 MG capsule Take 1 capsule (12.5 mg total) by mouth daily. 30 capsule 3  . losartan (COZAAR) 100 MG tablet Take 1 tablet (100 mg total) by mouth at bedtime. 30 tablet 3  . rivaroxaban (XARELTO) 20 MG TABS tablet Take  1 tablet (20 mg total) by mouth daily with supper. 30 tablet 3  . Vitamin D, Ergocalciferol, (DRISDOL) 1.25 MG (50000 UT) CAPS capsule Take 1 capsule (50,000 Units total) by mouth every 7 (seven) days. 5 capsule 3   No facility-administered medications prior to visit.     No Known Allergies  ROS Review of Systems  Constitutional: Negative.   HENT: Negative.   Eyes: Negative.   Respiratory: Negative.   Cardiovascular: Negative.        Mild chest discomfort  Gastrointestinal: Negative.   Endocrine: Negative.   Genitourinary: Negative.   Musculoskeletal: Negative.   Skin: Negative.   Allergic/Immunologic: Negative.   Neurological: Negative.   Hematological: Negative.   Psychiatric/Behavioral: Negative.       Objective:    Physical Exam  Constitutional: He is oriented to person, place, and time. He appears well-developed and well-nourished.  HENT:  Head: Normocephalic and atraumatic.  Eyes: Conjunctivae are normal.  Neck: Normal range of motion. Neck supple.  Cardiovascular: Normal rate, regular rhythm, normal heart sounds and intact distal pulses.  Pulmonary/Chest: Effort normal and breath sounds normal.  Abdominal: Soft. Bowel sounds are normal.  Musculoskeletal: Normal range of motion.  Neurological: He is alert and oriented to person, place, and time. He has normal reflexes.  Skin: Skin is warm and dry.  Psychiatric: He has a normal mood and affect. His behavior is normal. Judgment and thought content normal.  Nursing note and vitals reviewed.   BP 124/65   Pulse 82   Temp 97.6 F (36.4 C) (Oral)   Ht 5\' 8"  (1.727 m)   Wt 227 lb 9.6 oz (103.2 kg)   SpO2 96%   BMI 34.61 kg/m  Wt Readings from Last 3 Encounters:  12/26/18 227 lb 9.6 oz (103.2 kg)  11/24/18 224 lb 3.2 oz (101.7 kg)  11/17/18 222 lb 9.6 oz (101 kg)     Health Maintenance Due  Topic Date Due  . TETANUS/TDAP  05/15/1986  . COLONOSCOPY  05/14/2017    There are no preventive care reminders  to display for this patient.  Lab Results  Component Value Date   TSH 3.370 11/24/2018   Lab Results  Component Value Date   WBC 5.9 11/17/2018   HGB 13.3 11/17/2018   HCT 37.6 (L) 11/17/2018   MCV 89.7 11/17/2018   PLT 219 11/17/2018   Lab Results  Component Value Date   NA 141 11/17/2018   K 3.9 11/17/2018   CO2 26 11/17/2018   GLUCOSE 133 (H) 11/17/2018   BUN 11 11/17/2018   CREATININE 1.32 (H) 11/17/2018   BILITOT 0.4 11/16/2018   ALKPHOS 71  11/16/2018   AST 23 11/16/2018   ALT 29 11/16/2018   PROT 7.2 11/16/2018   ALBUMIN 4.2 11/16/2018   CALCIUM 8.8 (L) 11/17/2018   ANIONGAP 12 11/17/2018   Lab Results  Component Value Date   CHOL 200 (H) 11/24/2018   Lab Results  Component Value Date   HDL 37 (L) 11/24/2018   Lab Results  Component Value Date   LDLCALC 133 (H) 11/24/2018   Lab Results  Component Value Date   TRIG 166 (H) 11/24/2018   Lab Results  Component Value Date   CHOLHDL 5.4 (H) 11/24/2018   Lab Results  Component Value Date   HGBA1C 6.1 (A) 11/24/2018      Assessment & Plan:   1. Essential hypertension The current medical regimen is effective; blood pressure is stable at 124/65 today; continue present plan and medications as prescribed. He will continue to take medications as prescribed, to decrease high sodium intake, excessive alcohol intake, increase potassium intake, smoking cessation, and increase physical activity of at least 30 minutes of cardio activity daily. He will continue to follow Heart Healthy or DASH diet.  2. Shortness of breath Stable today. No signs or symptoms of respiratory distress noted or reported.   3. History of pulmonary embolism Denies sudden onset of dyspnea and coughing. Denies Productive frothy, pink-tinged sputum. Denies tachycardia, pallor, feelings of impending doom. Denies symptoms of DVT, history of A-fib, any recent surgeries, pregnancy, long-bone fractures, and prolonged inactivity (sedentary, long  distant traveling).   4. Healthcare maintenance - Urinalysis Dipstick  5. Follow up He will follow up in 3 months.   No orders of the defined types were placed in this encounter.   Orders Placed This Encounter  Procedures  . Urinalysis Dipstick    Referral Orders  No referral(s) requested today   Kathe Becton,  MSN, FNP-BC [place] [address] [place], [place] [address] [phone]- fax    Problem List Items Addressed This Visit      Cardiovascular and Mediastinum   Essential hypertension - Primary     Other   History of pulmonary embolism   Shortness of breath    Other Visit Diagnoses    Healthcare maintenance       Relevant Orders   Urinalysis Dipstick (Completed)   Follow up          No orders of the defined types were placed in this encounter.   Follow-up: Return in about 3 months (around 03/28/2019).    Azzie Glatter, FNP

## 2019-01-29 MED FILL — HYDROCHLOROTHIAZIDE 12.5 MG: 12.5 | 30 days supply | Qty: 30 | Fill #1

## 2019-01-29 MED FILL — LOSARTAN POTASSIUM 100 MG T: 100 | 30 days supply | Qty: 30 | Fill #1

## 2019-01-29 MED FILL — AMLODIPINE BESYLATE 10 MG T: 10 | 30 days supply | Qty: 30 | Fill #1

## 2019-01-29 MED FILL — VIT D2 1.25 MG (50,000 UNIT: 1.25 MG | 35 days supply | Qty: 5 | Fill #1

## 2019-01-29 MED FILL — ALLOPURINOL 300 MG TAB: 300 | 30 days supply | Qty: 30 | Fill #0

## 2019-01-29 MED FILL — ALBUTEROL SULFATE HFA 108 (: 108 (90 BAS | 25 days supply | Qty: 18 | Fill #2

## 2019-01-29 MED FILL — XARELTO 20 MG TABLET: 20 | 30 days supply | Qty: 30 | Fill #1

## 2019-02-28 ENCOUNTER — Emergency Department (HOSPITAL_BASED_OUTPATIENT_CLINIC_OR_DEPARTMENT_OTHER): Payer: Self-pay

## 2019-02-28 ENCOUNTER — Other Ambulatory Visit: Payer: Self-pay

## 2019-02-28 ENCOUNTER — Encounter (HOSPITAL_BASED_OUTPATIENT_CLINIC_OR_DEPARTMENT_OTHER): Payer: Self-pay | Admitting: *Deleted

## 2019-02-28 ENCOUNTER — Emergency Department (HOSPITAL_BASED_OUTPATIENT_CLINIC_OR_DEPARTMENT_OTHER)
Admission: EM | Admit: 2019-02-28 | Discharge: 2019-02-28 | Disposition: A | Discharge status: Home / Self Care | Payer: Self-pay | Attending: Emergency Medicine | Admitting: Emergency Medicine

## 2019-02-28 DIAGNOSIS — M545 Low back pain, unspecified: Secondary | ICD-10-CM

## 2019-02-28 DIAGNOSIS — R509 Fever, unspecified: Secondary | ICD-10-CM

## 2019-02-28 DIAGNOSIS — Z79899 Other long term (current) drug therapy: Secondary | ICD-10-CM | POA: Insufficient documentation

## 2019-02-28 DIAGNOSIS — I129 Hypertensive chronic kidney disease with stage 1 through stage 4 chronic kidney disease, or unspecified chronic kidney disease: Secondary | ICD-10-CM | POA: Insufficient documentation

## 2019-02-28 DIAGNOSIS — N183 Chronic kidney disease, stage 3 unspecified: Secondary | ICD-10-CM | POA: Insufficient documentation

## 2019-02-28 DIAGNOSIS — U071 COVID-19: Secondary | ICD-10-CM | POA: Insufficient documentation

## 2019-02-28 DIAGNOSIS — R109 Unspecified abdominal pain: Secondary | ICD-10-CM | POA: Insufficient documentation

## 2019-02-28 DIAGNOSIS — Z7901 Long term (current) use of anticoagulants: Secondary | ICD-10-CM | POA: Insufficient documentation

## 2019-02-28 DIAGNOSIS — N189 Chronic kidney disease, unspecified: Secondary | ICD-10-CM

## 2019-02-28 DIAGNOSIS — J45909 Unspecified asthma, uncomplicated: Secondary | ICD-10-CM | POA: Insufficient documentation

## 2019-02-28 LAB — URINALYSIS, ROUTINE W REFLEX MICROSCOPIC
Bilirubin Urine: NEGATIVE
Glucose, UA: NEGATIVE mg/dL
Ketones, ur: NEGATIVE mg/dL
Leukocytes,Ua: NEGATIVE
Nitrite: NEGATIVE
Protein, ur: NEGATIVE mg/dL
Specific Gravity, Urine: 1.025 (ref 1.005–1.030)
pH: 5.5 (ref 5.0–8.0)

## 2019-02-28 LAB — CBC WITH DIFFERENTIAL/PLATELET
Abs Immature Granulocytes: 0.02 10*3/uL (ref 0.00–0.07)
Basophils Absolute: 0 10*3/uL (ref 0.0–0.1)
Basophils Relative: 0 %
Eosinophils Absolute: 0 10*3/uL (ref 0.0–0.5)
Eosinophils Relative: 0 %
HCT: 41 % (ref 39.0–52.0)
Hemoglobin: 13.9 g/dL (ref 13.0–17.0)
Immature Granulocytes: 0 %
Lymphocytes Relative: 31 %
Lymphs Abs: 1.7 10*3/uL (ref 0.7–4.0)
MCH: 30.2 pg (ref 26.0–34.0)
MCHC: 33.9 g/dL (ref 30.0–36.0)
MCV: 89.1 fL (ref 80.0–100.0)
Monocytes Absolute: 0.5 10*3/uL (ref 0.1–1.0)
Monocytes Relative: 9 %
Neutro Abs: 3.3 10*3/uL (ref 1.7–7.7)
Neutrophils Relative %: 60 %
Platelets: 227 10*3/uL (ref 150–400)
RBC: 4.6 MIL/uL (ref 4.22–5.81)
RDW: 14.7 % (ref 11.5–15.5)
WBC: 5.6 10*3/uL (ref 4.0–10.5)
nRBC: 0 % (ref 0.0–0.2)

## 2019-02-28 LAB — COMPREHENSIVE METABOLIC PANEL
ALT: 31 U/L (ref 0–44)
AST: 32 U/L (ref 15–41)
Albumin: 4.4 g/dL (ref 3.5–5.0)
Alkaline Phosphatase: 58 U/L (ref 38–126)
Anion gap: 9 (ref 5–15)
BUN: 26 mg/dL — ABNORMAL HIGH (ref 6–20)
CO2: 27 mmol/L (ref 22–32)
Calcium: 8.3 mg/dL — ABNORMAL LOW (ref 8.9–10.3)
Chloride: 103 mmol/L (ref 98–111)
Creatinine, Ser: 1.77 mg/dL — ABNORMAL HIGH (ref 0.61–1.24)
GFR calc Af Amer: 50 mL/min — ABNORMAL LOW (ref >60)
GFR calc non Af Amer: 44 mL/min — ABNORMAL LOW (ref >60)
Glucose, Bld: 108 mg/dL — ABNORMAL HIGH (ref 70–99)
Potassium: 3.6 mmol/L (ref 3.5–5.1)
Sodium: 139 mmol/L (ref 135–145)
Total Bilirubin: 0.9 mg/dL (ref 0.3–1.2)
Total Protein: 8.2 g/dL — ABNORMAL HIGH (ref 6.5–8.1)

## 2019-02-28 LAB — LACTIC ACID, PLASMA: Lactic Acid, Venous: 1.7 mmol/L (ref 0.5–1.9)

## 2019-02-28 LAB — SARS CORONAVIRUS 2 BY RT PCR (HOSPITAL ORDER, PERFORMED IN ~~LOC~~ HOSPITAL LAB): SARS Coronavirus 2: POSITIVE — AB

## 2019-02-28 LAB — URINALYSIS, MICROSCOPIC (REFLEX)

## 2019-02-28 MED ORDER — ACETAMINOPHEN 325 MG PO TABS
650.0000 mg | ORAL_TABLET | Freq: Once | ORAL | Status: AC
  Administered 2019-02-28: 650 mg via ORAL
  Filled 2019-02-28: qty 2

## 2019-02-28 NOTE — ED Notes (Signed)
Pt to CT

## 2019-02-28 NOTE — ED Triage Notes (Signed)
Pt c/o lower back pain x 1 week , denies injury

## 2019-02-28 NOTE — Discharge Instructions (Addendum)
Please quarantine for a full 10 days since your symptoms started  Take Tylenol as needed for back pain and fever. Avoid NSAIDs (Naproxen or Ibuprofen) Drink plenty of water to stay hydrated Return if you are worsening
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
## 2019-03-01 ENCOUNTER — Telehealth: Payer: Self-pay | Admitting: Unknown Physician Specialty

## 2019-03-01 NOTE — Telephone Encounter (Signed)
Discussed with patient about Covid symptoms and the use of bamlanivimab, a monoclonal antibody infusion for those with mild to moderate Covid symptoms and at a high risk of hospitalization.  In talking to patient, it is unclear when his symptoms started and therefore, cannot be sure he is less than 10 days of symptom onset.  Therefore mab is not his best treatment option.

## 2019-03-05 LAB — CULTURE, BLOOD (ROUTINE X 2)
Culture: NO GROWTH
Culture: NO GROWTH
Special Requests: ADEQUATE
Special Requests: ADEQUATE

## 2019-03-08 MED FILL — HYDROCHLOROTHIAZIDE 12.5 MG: 12.5 | 30 days supply | Qty: 30 | Fill #2

## 2019-03-08 MED FILL — !VENTOLIN HFA INHALER: 108 (90 BAS | 25 days supply | Qty: 18 | Fill #3

## 2019-03-08 MED FILL — AMLODIPINE BESYLATE 10 MG T: 10 | 30 days supply | Qty: 30 | Fill #2

## 2019-03-08 MED FILL — LOSARTAN POTASSIUM 100 MG T: 100 | 30 days supply | Qty: 30 | Fill #2

## 2019-03-08 MED FILL — XARELTO 20 MG TABLET: 20 | 30 days supply | Qty: 30 | Fill #2

## 2019-03-08 MED FILL — ALLOPURINOL 300 MG TAB: 300 | 30 days supply | Qty: 30 | Fill #1

## 2019-03-09 ENCOUNTER — Telehealth: Payer: Self-pay

## 2019-03-09 NOTE — Telephone Encounter (Signed)
We received a fax for Rx Xarelto 20 mg.   Patient does not have insurance. He has been advised to call  [facility] to seek assistance for Xarelto. Phone number given to patient. He is advised to call today.   [facility] [place], [place] - [address]  [address], [place] [address]  Phone:  [phone] Fax:  [phone]

## 2019-03-27 ENCOUNTER — Ambulatory Visit: Payer: Self-pay | Admitting: Family Medicine

## 2019-04-16 MED FILL — AMLODIPINE BESYLATE 10 MG T: 10 | 30 days supply | Qty: 30 | Fill #3

## 2019-04-16 MED FILL — HYDROCHLOROTHIAZIDE 12.5 MG: 12.5 | 30 days supply | Qty: 30 | Fill #3

## 2019-04-16 MED FILL — ALBUTEROL SULFATE HFA 108 (: 108 (90 BAS | 25 days supply | Qty: 18 | Fill #4

## 2019-04-16 MED FILL — LOSARTAN POTASSIUM 100 MG T: 100 | 30 days supply | Qty: 30 | Fill #3

## 2019-04-16 MED FILL — XARELTO 20 MG TABLET: 20 | 30 days supply | Qty: 30 | Fill #3

## 2019-06-22 ENCOUNTER — Other Ambulatory Visit: Payer: Self-pay | Admitting: Family Medicine

## 2019-06-22 DIAGNOSIS — Z86711 Personal history of pulmonary embolism: Secondary | ICD-10-CM

## 2019-06-22 DIAGNOSIS — I10 Essential (primary) hypertension: Secondary | ICD-10-CM

## 2019-08-27 ENCOUNTER — Other Ambulatory Visit: Payer: Self-pay | Admitting: Family Medicine

## 2019-08-27 DIAGNOSIS — Z86711 Personal history of pulmonary embolism: Secondary | ICD-10-CM

## 2019-08-27 DIAGNOSIS — I10 Essential (primary) hypertension: Secondary | ICD-10-CM

## 2019-08-27 MED FILL — ALBUTEROL SULFATE HFA 108 (: 108 (90 BAS | 25 days supply | Qty: 18 | Fill #6

## 2019-08-27 MED FILL — LOSARTAN POTASSIUM 100 MG T: 100 | 30 days supply | Qty: 30 | Fill #0

## 2019-08-27 MED FILL — XARELTO 20 MG TABLET: 20 | 30 days supply | Qty: 30 | Fill #0

## 2019-08-27 MED FILL — ALLOPURINOL 300 MG TAB: 300 | 30 days supply | Qty: 30 | Fill #2

## 2019-08-27 MED FILL — HYDROCHLOROTHIAZIDE 12.5 MG: 12.5 | 30 days supply | Qty: 30 | Fill #0

## 2019-11-06 ENCOUNTER — Other Ambulatory Visit: Payer: Self-pay | Admitting: Family Medicine

## 2019-11-06 DIAGNOSIS — Z86711 Personal history of pulmonary embolism: Secondary | ICD-10-CM

## 2019-11-06 DIAGNOSIS — I10 Essential (primary) hypertension: Secondary | ICD-10-CM

## 2019-11-06 MED FILL — ALLOPURINOL 300 MG TAB: 300 | 30 days supply | Qty: 30 | Fill #3

## 2019-12-03 ENCOUNTER — Telehealth: Payer: Self-pay | Admitting: Family Medicine

## 2019-12-03 ENCOUNTER — Other Ambulatory Visit: Payer: Self-pay | Admitting: Nurse Practitioner

## 2019-12-03 DIAGNOSIS — Z86711 Personal history of pulmonary embolism: Secondary | ICD-10-CM

## 2019-12-03 DIAGNOSIS — I10 Essential (primary) hypertension: Secondary | ICD-10-CM

## 2019-12-03 DIAGNOSIS — R0602 Shortness of breath: Secondary | ICD-10-CM

## 2019-12-03 MED ORDER — LOSARTAN POTASSIUM 100 MG PO TABS: 100.0000 mg | ORAL_TABLET | Freq: Every day | ORAL | 0 refills | Status: DC

## 2019-12-03 MED ORDER — RIVAROXABAN 20 MG PO TABS: ORAL_TABLET | ORAL | 0 refills | Status: DC

## 2019-12-03 MED ORDER — AMLODIPINE BESYLATE 10 MG PO TABS: 10.0000 mg | ORAL_TABLET | Freq: Every day | ORAL | 3 refills | Status: AC

## 2019-12-03 MED ORDER — HYDROCHLOROTHIAZIDE 12.5 MG PO CAPS: 12.5000 mg | ORAL_CAPSULE | Freq: Every day | ORAL | 0 refills | Status: DC

## 2019-12-03 MED ORDER — ALBUTEROL SULFATE HFA 108 (90 BASE) MCG/ACT IN AERS: 2.0000 | INHALATION_SPRAY | Freq: Four times a day (QID) | RESPIRATORY_TRACT | 12 refills | Status: AC | PRN

## 2019-12-03 MED ORDER — ALLOPURINOL 300 MG PO TABS: 300.0000 mg | ORAL_TABLET | Freq: Every day | ORAL | 3 refills | Status: AC

## 2019-12-03 NOTE — Progress Notes (Signed)
   [facility] Patient [facility] [address] [place], [place]  [address] Phone:  [phone]   Fax:  [phone]  Medication refills sent per pt request 30 day supply.

## 2019-12-03 NOTE — Telephone Encounter (Signed)
Done

## 2019-12-05 NOTE — Telephone Encounter (Signed)
Done

## 2019-12-19 ENCOUNTER — Ambulatory Visit: Payer: Self-pay | Admitting: Family Medicine

## 2019-12-31 ENCOUNTER — Other Ambulatory Visit: Payer: Self-pay | Admitting: Nurse Practitioner

## 2019-12-31 DIAGNOSIS — I10 Essential (primary) hypertension: Secondary | ICD-10-CM

## 2019-12-31 DIAGNOSIS — Z86711 Personal history of pulmonary embolism: Secondary | ICD-10-CM

## 2019-12-31 NOTE — Telephone Encounter (Signed)
Please see patient's medication request 

## 2020-04-02 ENCOUNTER — Other Ambulatory Visit: Payer: Self-pay | Admitting: Nurse Practitioner

## 2020-04-02 DIAGNOSIS — Z86711 Personal history of pulmonary embolism: Secondary | ICD-10-CM

## 2020-04-02 DIAGNOSIS — I10 Essential (primary) hypertension: Secondary | ICD-10-CM

## 2020-05-02 ENCOUNTER — Other Ambulatory Visit: Payer: Self-pay | Admitting: Nurse Practitioner

## 2020-05-02 DIAGNOSIS — Z86711 Personal history of pulmonary embolism: Secondary | ICD-10-CM

## 2020-05-02 DIAGNOSIS — I10 Essential (primary) hypertension: Secondary | ICD-10-CM

## 2021-06-08 IMAGING — DX DG CHEST 1V PORT
1 series · 1 of 1 positions shown · non-contrast
Comparison: Chest radiograph dated 11/16/2018.

CLINICAL DATA: 53-year-old male with back pain and fever.

EXAM:
PORTABLE CHEST 1 VIEW

[chest ap]
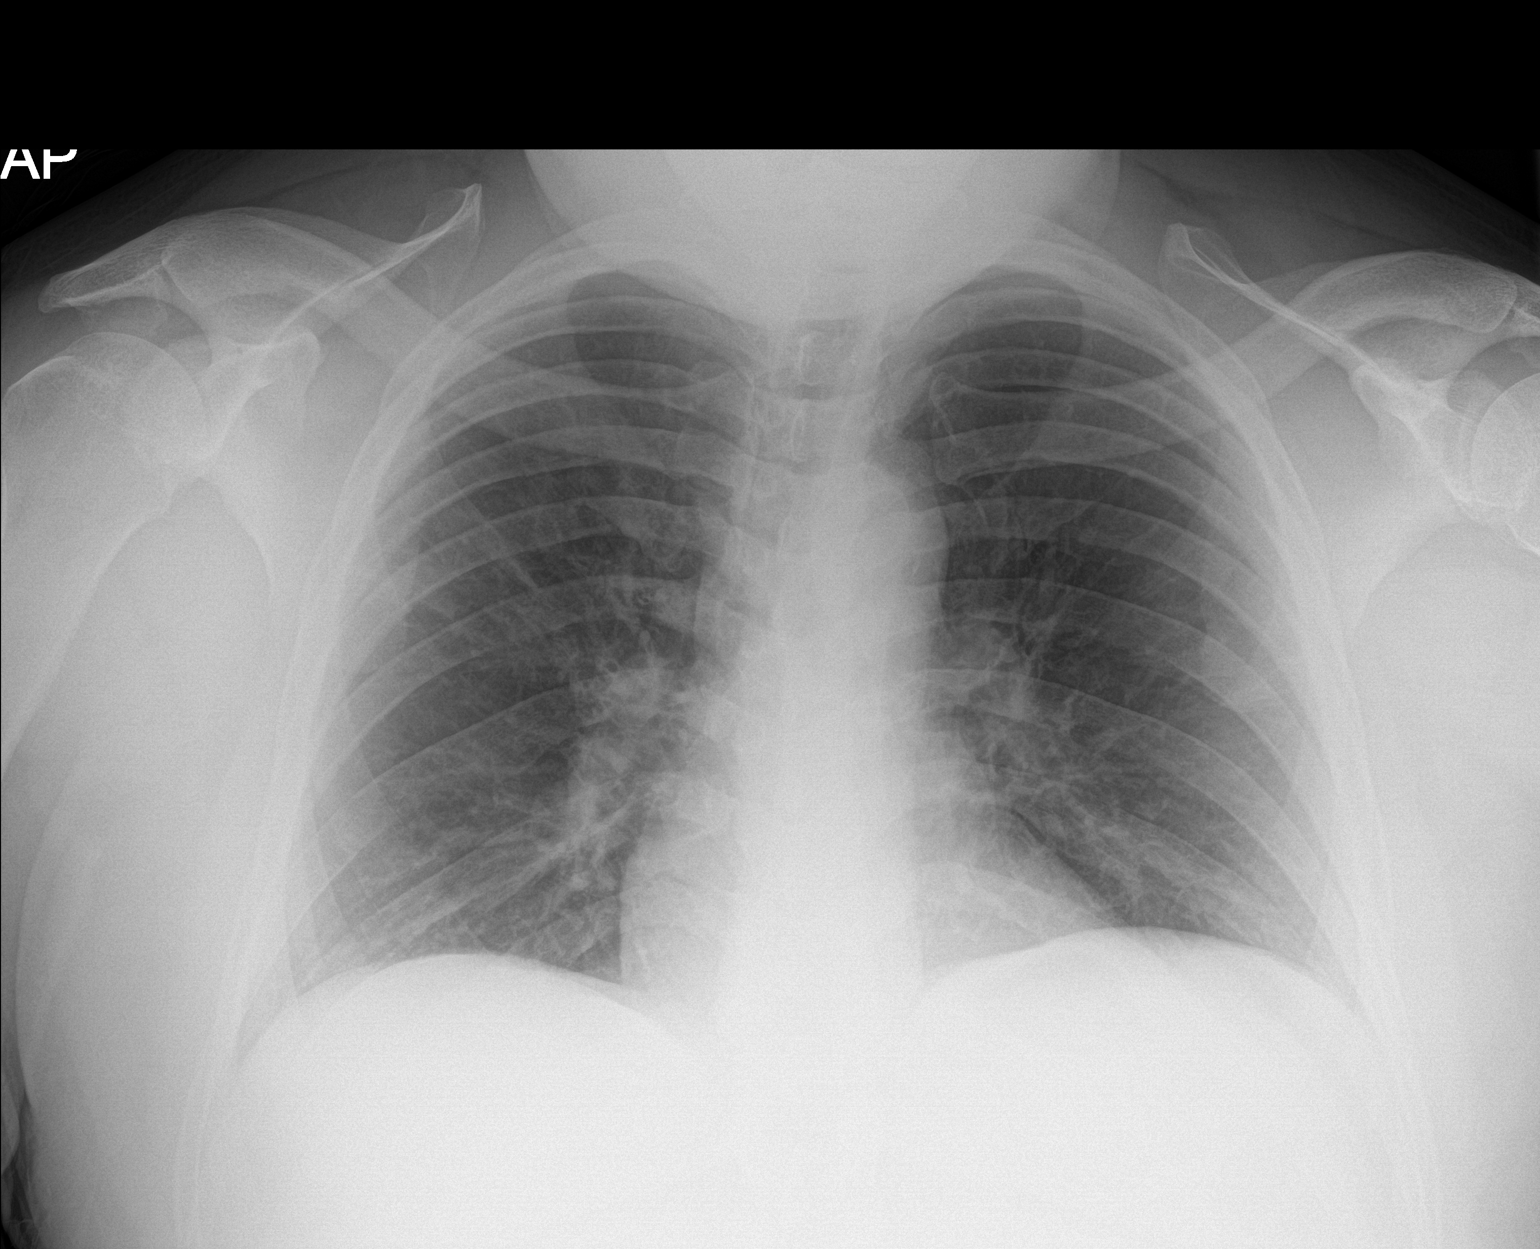

[1 of 1 positions shown; findings below may reference images not displayed]

FINDINGS: There is no focal consolidation. Minimal bilateral faint streaky
densities, likely atelectasis. Atypical infiltrate is not excluded.
Clinical correlation is recommended. There is no pleural effusion or
pneumothorax. The cardiac silhouette is within limits. No acute
osseous pathology.
IMPRESSION: No focal consolidation.

## 2023-02-13 ENCOUNTER — Emergency Department (HOSPITAL_BASED_OUTPATIENT_CLINIC_OR_DEPARTMENT_OTHER)
Admission: EM | Admit: 2023-02-13 | Discharge: 2023-02-14 | Disposition: A | Discharge status: Home / Self Care | Payer: 59 | Attending: Emergency Medicine | Admitting: Emergency Medicine

## 2023-02-13 ENCOUNTER — Encounter (HOSPITAL_BASED_OUTPATIENT_CLINIC_OR_DEPARTMENT_OTHER): Payer: Self-pay

## 2023-02-13 ENCOUNTER — Emergency Department (HOSPITAL_BASED_OUTPATIENT_CLINIC_OR_DEPARTMENT_OTHER): Payer: 59

## 2023-02-13 ENCOUNTER — Other Ambulatory Visit: Payer: Self-pay

## 2023-02-13 DIAGNOSIS — E1165 Type 2 diabetes mellitus with hyperglycemia: Secondary | ICD-10-CM | POA: Diagnosis not present

## 2023-02-13 DIAGNOSIS — I10 Essential (primary) hypertension: Secondary | ICD-10-CM | POA: Insufficient documentation

## 2023-02-13 DIAGNOSIS — R739 Hyperglycemia, unspecified: Secondary | ICD-10-CM

## 2023-02-13 DIAGNOSIS — R3589 Other polyuria: Secondary | ICD-10-CM | POA: Diagnosis present

## 2023-02-13 LAB — COMPREHENSIVE METABOLIC PANEL
ALT: 38 U/L (ref 0–44)
AST: 35 U/L (ref 15–41)
Albumin: 4.1 g/dL (ref 3.5–5.0)
Alkaline Phosphatase: 104 U/L (ref 38–126)
Anion gap: 9 (ref 5–15)
BUN: 20 mg/dL (ref 6–20)
CO2: 23 mmol/L (ref 22–32)
Calcium: 8.6 mg/dL — ABNORMAL LOW (ref 8.9–10.3)
Chloride: 93 mmol/L — ABNORMAL LOW (ref 98–111)
Creatinine, Ser: 1.79 mg/dL — ABNORMAL HIGH (ref 0.61–1.24)
GFR, Estimated: 44 mL/min — ABNORMAL LOW (ref >60)
Glucose, Bld: 858 mg/dL (ref 70–99)
Potassium: 4.6 mmol/L (ref 3.5–5.1)
Sodium: 125 mmol/L — ABNORMAL LOW (ref 135–145)
Total Bilirubin: 0.6 mg/dL (ref 0.0–1.2)
Total Protein: 7.1 g/dL (ref 6.5–8.1)

## 2023-02-13 LAB — CBG MONITORING, ED
Glucose-Capillary: >600 mg/dL (ref 70–99)
Glucose-Capillary: >600 mg/dL (ref 70–99)

## 2023-02-13 LAB — TROPONIN I (HIGH SENSITIVITY): Troponin I (High Sensitivity): 5 ng/L (ref <18)

## 2023-02-13 LAB — CBC WITH DIFFERENTIAL/PLATELET
Abs Immature Granulocytes: 0.01 10*3/uL (ref 0.00–0.07)
Basophils Absolute: 0 10*3/uL (ref 0.0–0.1)
Basophils Relative: 1 %
Eosinophils Absolute: 0.2 10*3/uL (ref 0.0–0.5)
Eosinophils Relative: 4 %
HCT: 34.4 % — ABNORMAL LOW (ref 39.0–52.0)
Hemoglobin: 12.7 g/dL — ABNORMAL LOW (ref 13.0–17.0)
Immature Granulocytes: 0 %
Lymphocytes Relative: 28 %
Lymphs Abs: 1.4 10*3/uL (ref 0.7–4.0)
MCH: 30.8 pg (ref 26.0–34.0)
MCHC: 36.9 g/dL — ABNORMAL HIGH (ref 30.0–36.0)
MCV: 83.3 fL (ref 80.0–100.0)
Monocytes Absolute: 0.3 10*3/uL (ref 0.1–1.0)
Monocytes Relative: 7 %
Neutro Abs: 3 10*3/uL (ref 1.7–7.7)
Neutrophils Relative %: 60 %
Platelets: 183 10*3/uL (ref 150–400)
RBC: 4.13 MIL/uL — ABNORMAL LOW (ref 4.22–5.81)
RDW: 12.8 % (ref 11.5–15.5)
WBC: 5 10*3/uL (ref 4.0–10.5)
nRBC: 0 % (ref 0.0–0.2)

## 2023-02-13 LAB — I-STAT VENOUS BLOOD GAS, ED
Acid-Base Excess: 2 mmol/L (ref 0.0–2.0)
Bicarbonate: 26.5 mmol/L (ref 20.0–28.0)
Calcium, Ion: 1.08 mmol/L — ABNORMAL LOW (ref 1.15–1.40)
HCT: 38 % — ABNORMAL LOW (ref 39.0–52.0)
Hemoglobin: 12.9 g/dL — ABNORMAL LOW (ref 13.0–17.0)
O2 Saturation: 96 %
Patient temperature: 99.6
Potassium: 4.6 mmol/L (ref 3.5–5.1)
Sodium: 127 mmol/L — ABNORMAL LOW (ref 135–145)
TCO2: 28 mmol/L (ref 22–32)
pCO2, Ven: 42.3 mm[Hg] — ABNORMAL LOW (ref 44–60)
pH, Ven: 7.407 (ref 7.25–7.43)
pO2, Ven: 85 mm[Hg] — ABNORMAL HIGH (ref 32–45)

## 2023-02-13 LAB — LIPASE, BLOOD: Lipase: 77 U/L — ABNORMAL HIGH (ref 11–51)

## 2023-02-13 MED ORDER — SODIUM CHLORIDE 0.9 % IV BOLUS
1000.0000 mL | Freq: Once | INTRAVENOUS | Status: AC
  Administered 2023-02-13: 1000 mL via INTRAVENOUS

## 2023-02-13 MED ORDER — INSULIN ASPART 100 UNIT/ML IJ SOLN
9.0000 [IU] | Freq: Once | INTRAMUSCULAR | Status: AC
  Administered 2023-02-13: 9 [IU] via SUBCUTANEOUS

## 2023-02-13 MED ORDER — METFORMIN HCL 500 MG PO TABS: 1000.0000 mg | ORAL_TABLET | Freq: Two times a day (BID) | ORAL | 0 refills | Status: AC

## 2023-02-13 NOTE — ED Provider Notes (Signed)
  Provider Note MRN:  [id number]  Arrival date & time: 02/14/23    ED Course and Medical Decision Making  Assumed care of patient at sign-out or upon transfer.  Hyperglycemia, awaiting metabolic panel.  Anticipating this will not be DKA nor HHS and patient may be a candidate for discharge if we can control the blood sugar.  Recently stopped his metformin .  2 AM update: Patient's blood sugar greater than 800, given some additional IV insulin  on top of the NovoLog .  Monitored for a few hours with slow steady drop of blood sugar into the 300s.  No evidence of DKA or other emergent process, he will restart his metformin , follow-up closely with primary care doctor.  Procedures  Final Clinical Impressions(s) / ED Diagnoses     ICD-10-CM   1. Hyperglycemia  R73.9       ED Discharge Orders          Ordered    metFORMIN  (GLUCOPHAGE ) 500 MG tablet  2 times daily with meals        02/13/23 2233              Discharge Instructions      You were evaluated in the Emergency Department and after careful evaluation, we did not find any emergent condition requiring admission or further testing in the hospital.  Your exam/testing today was overall reassuring.  Important that you restart your metformin  and follow-up with your primary care doctor.  Please return to the Emergency Department if you experience any worsening of your condition.  Thank you for allowing us  to be a part of your care.       Ozell HERO. Theadore, MD [facility] Emergency Medicine [facility] [email or web address]    Theadore Ozell HERO, MD 02/14/23 [phone]

## 2023-02-13 NOTE — Discharge Instructions (Addendum)
 You were evaluated in the Emergency Department and after careful evaluation, we did not find any emergent condition requiring admission or further testing in the hospital.  Your exam/testing today was overall reassuring.  Important that you restart your metformin  and follow-up with your primary care doctor.  Please return to the Emergency Department if you experience any worsening of your condition.  Thank you for allowing us  to be a part of your care.

## 2023-02-13 NOTE — ED Provider Notes (Signed)
 [place] EMERGENCY DEPARTMENT AT [facility] Provider Note   CSN: [id number] Arrival date & time: 02/13/23  2200     History Chief Complaint  Patient presents with   Dizziness    HPI Loyalty Brashier is a 58 y.o. male presenting for malaise fatigue weakness generalized lightheadedness polyuria polydipsia.  He is a 58 year old male minimal medical history accepted diabetes hypertension.  States that he stopped taking all of his medications because he was daily nauseous. All the symptoms got worse for the past 2 weeks.  Chart review from external sources reveals that his A1c has been climbing from 7-8 in the outpatient setting over his past visits..   Patient's recorded medical, surgical, social, medication list and allergies were reviewed in the Snapshot window as part of the initial history.   Review of Systems   Review of Systems  Constitutional:  Positive for fatigue. Negative for chills and fever.  HENT:  Negative for ear pain and sore throat.   Eyes:  Negative for pain and visual disturbance.  Respiratory:  Negative for cough and shortness of breath.   Cardiovascular:  Negative for chest pain and palpitations.  Gastrointestinal:  Negative for abdominal pain and vomiting.  Genitourinary:  Positive for frequency. Negative for dysuria and hematuria.  Musculoskeletal:  Negative for arthralgias and back pain.  Skin:  Negative for color change and rash.  Neurological:  Positive for dizziness and light-headedness. Negative for seizures and syncope.  All other systems reviewed and are negative.   Physical Exam Updated Vital Signs BP (!) 167/94 (BP Location: Right Arm)   Pulse 74   Temp 98.1 F (36.7 C) (Oral)   Resp 16   Ht 5' 8 (1.727 m)   Wt 97.1 kg   SpO2 96%   BMI 32.54 kg/m  Physical Exam Vitals and nursing note reviewed.  Constitutional:      General: He is not in acute distress.    Appearance: He is well-developed.  HENT:     Head: Normocephalic  and atraumatic.  Eyes:     Conjunctiva/sclera: Conjunctivae normal.  Cardiovascular:     Rate and Rhythm: Regular rhythm. Tachycardia present.     Heart sounds: No murmur heard. Pulmonary:     Effort: Pulmonary effort is normal. No respiratory distress.     Breath sounds: Normal breath sounds.  Abdominal:     Palpations: Abdomen is soft.     Tenderness: There is no abdominal tenderness.  Musculoskeletal:        General: No swelling.     Cervical back: Neck supple.  Skin:    General: Skin is warm and dry.     Capillary Refill: Capillary refill takes less than 2 seconds.  Neurological:     Mental Status: He is alert.  Psychiatric:        Mood and Affect: Mood normal.      ED Course/ Medical Decision Making/ A&P    Procedures Procedures   Medications Ordered in ED Medications  sodium chloride  0.9 % bolus 1,000 mL (1,000 mLs Intravenous New Bag/Given 02/13/23 2242)  sodium chloride  0.9 % bolus 1,000 mL (1,000 mLs Intravenous New Bag/Given 02/13/23 2242)  insulin  aspart (novoLOG ) injection 9 Units (9 Units Subcutaneous Given 02/13/23 2229)    Medical Decision Making:    Lem Peary is a 58 y.o. male who presented to the ED today with multiple symptoms detailed above.     Additional history discussed with patient's family/caregivers.  External chart has been reviewed  including baseline labs from outpatient facility. Patient placed on continuous vitals and telemetry monitoring while in ED which was reviewed periodically.   Complete initial physical exam performed, notably the patient  was medically stable no acute distress..      Reviewed and confirmed nursing documentation for past medical history, family history, social history.    Initial Assessment:   His HPI and physical exam findings are most consistent with hyperglycemic crisis secondary to medication nonadherence. I have considered diabetic ketoacidosis, however his clinical presentation seems less likely.  Will  perform a lab workup including metabolic's and blood gas to evaluate for and screen patient for HHS and DKA.  However anticipate that his presentation given the factor that he presented very early in his pathology will be more consistent with just simple hyperglycemia.  Will aggressively hydrate him here in the emergency room and give him a dose of subcutaneous insulin  to stabilize.  If he is not DKA, anticipate that he will transition over to his p.o. metformin  while obtaining very close follow-up with his primary care provider. If he is in DKA, care will have to be escalated.  Care of patient will be handed off to oncoming provider pending the results of these labs.  Clinical Impression:  1. Hyperglycemia      Data Unavailable   Final Clinical Impression(s) / ED Diagnoses Final diagnoses:  Hyperglycemia    Rx / DC Orders ED Discharge Orders          Ordered    metFORMIN  (GLUCOPHAGE ) 500 MG tablet  2 times daily with meals        02/13/23 2233              Jerral Meth, MD 02/16/23 1455

## 2023-02-13 NOTE — ED Triage Notes (Addendum)
 Pt reports 4 days of dizziness, increased urination, blurred vision, and feeling nauseous. Pt states he is borderline diabetic and does not like to take his Metformin  because of the side effects. Pt tried to make appointment with PCP and would not be seen for 30 days. Told to come to ED. Pt also endorses dry mouth and very thirsty.

## 2023-02-13 NOTE — ED Notes (Signed)
 Serum glucose 858. Primary RN, Drinda Butts and EDP Bero notified.

## 2023-02-14 LAB — BETA-HYDROXYBUTYRIC ACID: Beta-Hydroxybutyric Acid: 0.23 mmol/L (ref 0.05–0.27)

## 2023-02-14 LAB — CBG MONITORING, ED
Glucose-Capillary: 381 mg/dL — ABNORMAL HIGH (ref 70–99)
Glucose-Capillary: 455 mg/dL — ABNORMAL HIGH (ref 70–99)

## 2023-02-14 MED ORDER — METHOCARBAMOL 500 MG PO TABS
1000.0000 mg | ORAL_TABLET | Freq: Once | ORAL | Status: AC
  Administered 2023-02-14: 1000 mg via ORAL
  Filled 2023-02-14: qty 2

## 2023-02-14 MED ORDER — INSULIN ASPART 100 UNIT/ML IV SOLN
5.0000 [IU] | Freq: Once | INTRAVENOUS | Status: AC
  Administered 2023-02-14: 5 [IU] via INTRAVENOUS

## 2023-02-14 NOTE — ED Notes (Signed)
 Pt is reporting leg cramps. Pt asked to stand up to stretch legs. Pt able to stand and put pressure on legs. Pt back in stretcher.
